# Patient Record
Sex: Male | Born: 1935 | Race: White | Hispanic: No | Marital: Married
Health system: Southern US, Community
[De-identification: ages and names within clinical notes are randomized; demographics above are authoritative.]

---

## 2009-03-18 ENCOUNTER — Inpatient Hospital Stay (HOSPITAL_COMMUNITY): Admission: EM | Admit: 2009-03-18 | Discharge: 2009-03-21 | Payer: Self-pay | Admitting: Emergency Medicine

## 2009-03-20 ENCOUNTER — Encounter (INDEPENDENT_AMBULATORY_CARE_PROVIDER_SITE_OTHER): Payer: Self-pay | Admitting: *Deleted

## 2009-03-21 ENCOUNTER — Ambulatory Visit: Payer: Self-pay | Admitting: Vascular Surgery

## 2010-04-28 ENCOUNTER — Emergency Department: Payer: Self-pay | Admitting: Emergency Medicine

## 2011-02-26 LAB — CBC
Hemoglobin: 14.9 g/dL (ref 13.0–17.0)
MCHC: 35 g/dL (ref 30.0–36.0)
MCV: 89.7 fL (ref 78.0–100.0)
RBC: 4.73 MIL/uL (ref 4.22–5.81)
RDW: 14 % (ref 11.5–15.5)

## 2011-02-26 LAB — COMPREHENSIVE METABOLIC PANEL
CO2: 24 mEq/L (ref 19–32)
Calcium: 9 mg/dL (ref 8.4–10.5)
Creatinine, Ser: 1.38 mg/dL (ref 0.4–1.5)
GFR calc Af Amer: >60 mL/min (ref >60)
GFR calc non Af Amer: 50 mL/min — ABNORMAL LOW (ref >60)
Glucose, Bld: 113 mg/dL — ABNORMAL HIGH (ref 70–99)
Total Protein: 6.2 g/dL (ref 6.0–8.3)

## 2011-02-26 LAB — TSH: TSH: 4.991 u[IU]/mL — ABNORMAL HIGH (ref 0.350–4.500)

## 2011-02-26 LAB — PROTIME-INR
INR: 1 (ref 0.00–1.49)
Prothrombin Time: 13.3 seconds (ref 11.6–15.2)

## 2011-02-26 LAB — POCT CARDIAC MARKERS
CKMB, poc: <1 ng/mL — ABNORMAL LOW (ref 1.0–8.0)
Myoglobin, poc: 105 ng/mL (ref 12–200)
Troponin i, poc: <0.05 ng/mL (ref 0.00–0.09)

## 2011-02-26 LAB — T4, FREE: Free T4: 0.96 ng/dL (ref 0.80–1.80)

## 2011-02-26 LAB — URINALYSIS, ROUTINE W REFLEX MICROSCOPIC
Glucose, UA: NEGATIVE mg/dL
Ketones, ur: NEGATIVE mg/dL
Nitrite: NEGATIVE
Protein, ur: NEGATIVE mg/dL
Urobilinogen, UA: 1 mg/dL (ref 0.0–1.0)

## 2011-02-26 LAB — TROPONIN I: Troponin I: 0.02 ng/mL (ref 0.00–0.06)

## 2011-02-26 LAB — LIPID PANEL
HDL: 33 mg/dL — ABNORMAL LOW (ref >39)
Triglycerides: 101 mg/dL (ref <150)

## 2011-02-26 LAB — DIFFERENTIAL
Lymphocytes Relative: 19 % (ref 12–46)
Lymphs Abs: 1.4 10*3/uL (ref 0.7–4.0)
Neutrophils Relative %: 73 % (ref 43–77)

## 2011-02-26 LAB — CK TOTAL AND CKMB (NOT AT ARMC)
CK, MB: 1.2 ng/mL (ref 0.3–4.0)
Relative Index: INVALID (ref 0.0–2.5)

## 2011-02-26 LAB — T3, FREE: T3, Free: 2.9 pg/mL (ref 2.3–4.2)

## 2011-04-02 NOTE — H&P (Signed)
NAME:  James Gibson, James Gibson NO.:  [id number]   MEDICAL RECORD NO.:  [id number]          PATIENT TYPE:  EMS   LOCATION:  MAJO                         FACILITY:  [facility]   PHYSICIAN:  Manus Gunning, MD      DATE OF BIRTH:  Mar 25, 1930   DATE OF ADMISSION:  03/18/2009  DATE OF DISCHARGE:                              HISTORY & PHYSICAL   ADMITTING SERVICE:  Hospitalist [facility].   CHIEF COMPLAINT:  Syncopal episode.   HISTORY OF PRESENT ILLNESS:  Mr. Depuy is a 77 year old Caucasian male  who was walking to his shed to obtain a jug of gas for his weed whacker  when he suddenly experienced an episode of syncope where he suddenly  lost consciousness and hit the ground.  His wife was sitting on the  porch and witnessed the episode.  Said he was on the floor for  approximately 30 seconds.  There were no seizure movements.  He was just  placid on the floor.  The patient claims that he did not have bowel or  bladder incontinence.  No perceiving symptoms including no chest pain or  palpitations, no headache or aura, no tenderness, no blurring of vision.  He claims he woke up, he got up and now was able to walk to the front  porch, but in doing so, he claims his legs felt like lead, and his  wife claims that he was unsteady on his feet.  The patient claims that  he does not know how long this lasted.  He came and he sat in the chair,  and then when he was able to get up approximately one hour later he felt  fine and he was steady.  He denies headaches, denies fevers, denies any  sick contacts.  No recent travel history.  No generalized weakness.  No  fatigue.  No tenderness.  No blurring of vision.  No odynophagia,  dysphagia.  No slurring of speech.  No chest pain, palpitations, PND or  orthopnea.  No cough or expectoration.  No abdominal pain.  No nausea,  vomiting, diarrhea, constipation.  No hematuria, polyuria, dysuria.  No  other musculoskeletal complaints.   PAST  MEDICAL/SURGICAL HISTORY:  1. Throat cancer status post partial laryngectomy.  2. Lung cancers, squamous cell, status post wedge resection with      complete resolution.  3. COPD, quit smoking approximately 15 years ago.  Used to smoke one      pack a day previously.  4. Paroxysmal atrial fibrillation, only two episodes documented.  5. Degenerative disk disease with cervical spine surgery.  6. 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   ALLERGIES:  HYDROCORTISONE, MORPHINE, COUGH MEDICATIONS, ALBUTEROL.   SOCIAL HISTORY:  Quit tobacco 15 years ago.  Was a one pack per day  smoker preceding that times approximately 40 years.  Quite alcohol.  Was  a social drinker.  Lives at home with his wife and daughter and family  lives close by.   FAMILY HISTORY:  Father dead  at age of 80, cardiac arrest.  Mother had  history of hypertensive disease.   HOME MEDICATIONS:  1. Cardizem 240 mg p.o. daily.  2. Flomax 0.4 mg daily.  3. Spiriva one capsule inhalation daily.  4. Bupropion 100 mg p.o. daily.  5. Finasteride 5 mg daily.  6. Flovent 110 mcg daily.  7. MiraLax daily p.r.n.   REVIEW OF SYSTEMS:  A 14-point review of systems performed, pertinent  positives negative as described above.   PHYSICAL EXAMINATION:  VITAL SIGNS:  At the time of presentation,  temperature 97.5, heart rate 64, respirations 20, blood pressure 124/55,  O2 saturation 98% on room air.  GENERAL:  Well-nourished, well-developed elderly gentleman sitting up in  bed comfortably, no apparent distress.  HEENT:  Normocephalic, atraumatic.  Moist oral mucosa.  No thrush or  post nasal drip.  Eyes:  Anicteric icterus.  Extraocular movements  intact.  Pupils equal, round and reactive to light and accommodation.  NECK:  Supple.  Good range of motion, no thyromegaly.  No carotid  bruits.  Neck veins appear normal.  CARDIOVASCULAR:  S1, S2.  Normal rate and rhythm.  No murmurs,  rubs or  gallops.  RESPIRATORY:  Air entry bilaterally equal.  No wheezes, rales or  rhonchi.  ABDOMEN:  Soft, nontender, nondistended, positive bowel sounds.  No  organomegaly.  EXTREMITIES:  NO cyanosis, clubbing or edema.  Positive bilateral  dorsalis pedis.  CNS:  Alert and oriented x3.  Cranial nerves II-XII grossly intact.  Power, sensation, reflex bilaterally symmetrical.  SKIN:  No breakdown.  No ulcerations or masses.  HEME/ONC:  No palpable lymphadenopathy.  Echymoses, bruising or  petechiae.   LABORATORY DATA:  WBC count 7300, hemoglobin 14.9, hematocrit 42.5,  platelets 143, polymorphs 73.  Sodium 138, potassium 4.3, chloride 108,  CO2 22, glucose 113, BUN 16, creatinine 1.38, calcium 9, total protein  6.2, albumin 3.8, AST 21, ALT 15.  Urinalysis demonstrates an  essentially negative.  X-ray of chest demonstrates pulmonary swelling,  no active process evident.  No bony abnormality seen.  Ribs appear no  evidence of rib fraction.  CT scan of the head was negative as well.   ASSESSMENT/PLAN:  1. Syncope.  Etiology of this is uncertain.  Could possibly be atrial      fibrillation, but this is unlikely as his atrial fibrillation      episodes have been perioperative.  Both have been perioperative,      and he maintains he has never had one since.  Also, could be      secondary to CVA.  He has some focal deficits.  Possible PA could      have resulted in these symptoms.  Therefore, we will obtain an MRI      as well as ultrasound bilateral carotid and also check a 2D      echocardiogram to assess left ventricular systolic function, though      this is unlikely to be a cause.  Place patient on telemetry to      assess for erythemogenic causes as well.  May require [place] monitor at the time of discharge.  2. Chronic obstructive pulmonary disease.  Continue home medications      and continue to monitor.  3. Paroxysmal atrial fibrillation.  Place on telemetry.   Continue with      diltiazem.  Of note, the patient's orthostatics have been measured      in  the emergency department, and he does appear to have been      orthostatic.  Standing blood pressure is 117/53, lying blood      pressure is 135/54.  This will need to be reassessed.  We will      start normal saline at 100 cc an hour and reevaluate in the      morning.  4. Deep vein thrombosis prophylaxis.  Start the patient on sequential      compression devices and Protonix 40 mg p.o. daily.      Manus Gunning, MD  Electronically Signed     SP/MEDQ  D:  03/18/2009  T:  03/19/2009  Job:  [id number]

## 2011-04-05 NOTE — Discharge Summary (Signed)
NAMECONSTANTINOS, James Gibson                ACCOUNT NO.:  [id number]   MEDICAL RECORD NO.:  [id number]          PATIENT TYPE:  INP   LOCATION:  3703                         FACILITY:  [facility]   PHYSICIAN:  Michelene Gardener, MD    DATE OF BIRTH:  August 18, 1930   DATE OF ADMISSION:  03/18/2009  DATE OF DISCHARGE:  03/21/2009                               DISCHARGE SUMMARY   DISCHARGE DIAGNOSES:  1. Syncope.  2. Carotid artery disease.  3. Diastolic congestive heart failure with grade 1 dysfunction.  4. Hypothyroidism.  5. Paroxysmal atrial fibrillation.  6. History of chronic obstructive pulmonary disease.  7. History of lung cancer.  8. History of throat cancer.   DISCHARGE MEDICATIONS:  1. Cardizem CD 240 mg p.o. once daily.  2. Flomax 0.4 mg once a day.  3. Spiriva 18 mcg 1 cap once a day.  4. Neosporin 100 mg p.o. once a day.  5. Flovent 1 puff twice daily.  6. Finasteride 5 mg once a day.  7. MiraLax once a day as needed.  8. Bupropion 100 mg once a day.   CONSULTATIONS:  Vascular Surgery consult.   PROCEDURES:  None.   DIAGNOSTIC STUDIES:  1. Chest x-ray on Mar 18, 2009, showed pulmonary scarring without acute      problem.  2. Unilateral left hip x-ray showed no acute abnormalities.  3. CT scan of the head without contrast on Mar 18, 2009, showed chronic-      appearing small vessel change with intermediate small-vessel      disease and old stroke.  4. 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  5. Echocardiogram on Mar 20, 2009, showed normal ejection fraction of      60% to 65%, wall motion is normal, and there is a grade 1 diastolic      dysfunction.  6. Bilateral carotid Doppler showed moderate-to-severe Blake origin      ICA and ECA with acoustic shadowing.  Right is 40% to 60% ICA      stenosis with highest end of a scale.  Vertebral artery flow is      anterior  grade.  Left is 40-60 ICA stenosis, mid range of his      scale.  Vertebral artery flow not insonated.   COURSE OF HOSPITALIZATION:  1. Syncope.  The patient was admitted to the hospital.  The patient      was placed in telemetry.  Three sets of troponin and cardiac      enzymes were done and they came to be normal.  CT scan of the head      was done and showed no evidence of acute problem.  MRI of the brain      was done and showed no evidence of acute problem and it showed      bilateral old strokes.  Carotid Doppler was done and it showed      bilateral carotid stenosis.  Vascular  Surgery was consulted, and      the patient will be followed as an outpatient for repeat carotid      Doppler.  2. Carotid artery disease.  As mentioned above, the patient has      bilateral stenosis, which is 40% to 60% bilaterally.  Vascular      Surgery was consulted during this hospital and they think his      current symptoms are not related to this carotid artery disease.      Recommendation by Vascular Surgery is to repeat his carotid Doppler      with his primary doctor within 6 months.  3. Diastolic congestive heart failure.  The patient has grade 1      stenosis.  The patient does not have any evidence of volume      overload.  No need for Lasix at this point.  The patient will      continue to follow as an outpatient.  4. Hypothyroidism.  TSH is very mildly elevated.  T3 and T4 are within      normal.  This is subclinical, no need for treatment.  We recommend      to repeat TSH within 3-6 months.  5. Paroxysmal AFib.  The patient was continued on Cardizem.  The      patient is not on Coumadin.  I advised the patient to follow with      her primary doctor to be assessed for possible Coumadin.  6. History of COPD, remained stable during this hospitalization.   Other medical conditions remained stable during this hospitalization.  The patient was discharged on Mar 21, 2009, in stable condition.    Total assessment time is 40 minutes.      Michelene Gardener, MD  Electronically Signed     Michelene Gardener, MD  Electronically Signed    NAE/MEDQ  D:  05/16/2009  T:  05/17/2009  Job:  [id number]

## 2012-06-01 ENCOUNTER — Inpatient Hospital Stay: Payer: Self-pay | Admitting: Internal Medicine

## 2012-06-01 LAB — URINALYSIS, COMPLETE
Glucose,UR: NEGATIVE mg/dL (ref 0–75)
Hyaline Cast: 64
Ph: 5 (ref 4.5–8.0)
Protein: NEGATIVE
Specific Gravity: 1.023 (ref 1.003–1.030)
Squamous Epithelial: 2
WBC UR: 22 /HPF (ref 0–5)

## 2012-06-01 LAB — COMPREHENSIVE METABOLIC PANEL
Alkaline Phosphatase: 132 U/L (ref 50–136)
Anion Gap: 10 (ref 7–16)
BUN: 29 mg/dL — ABNORMAL HIGH (ref 7–18)
Bilirubin,Total: 1.3 mg/dL — ABNORMAL HIGH (ref 0.2–1.0)
Calcium, Total: 9.6 mg/dL (ref 8.5–10.1)
Co2: 28 mmol/L (ref 21–32)
Creatinine: 2.14 mg/dL — ABNORMAL HIGH (ref 0.60–1.30)
EGFR (Non-African Amer.): 28 — ABNORMAL LOW
Glucose: 176 mg/dL — ABNORMAL HIGH (ref 65–99)
Osmolality: 280 (ref 275–301)
Potassium: 5.5 mmol/L — ABNORMAL HIGH (ref 3.5–5.1)
Sodium: 135 mmol/L — ABNORMAL LOW (ref 136–145)
Total Protein: 8.2 g/dL (ref 6.4–8.2)

## 2012-06-01 LAB — CBC
MCH: 26.7 pg (ref 26.0–34.0)
MCHC: 31.9 g/dL — ABNORMAL LOW (ref 32.0–36.0)
Platelet: 246 10*3/uL (ref 150–440)
RDW: 16 % — ABNORMAL HIGH (ref 11.5–14.5)

## 2012-06-01 LAB — TROPONIN I
Troponin-I: <0.02 ng/mL
Troponin-I: <0.02 ng/mL

## 2012-06-01 LAB — CK TOTAL AND CKMB (NOT AT ARMC): CK-MB: 0.8 ng/mL (ref 0.5–3.6)

## 2012-06-01 LAB — PROTIME-INR: Prothrombin Time: 18 secs — ABNORMAL HIGH (ref 11.5–14.7)

## 2012-06-02 LAB — HEMOGLOBIN A1C: Hemoglobin A1C: 5.6 % (ref 4.2–6.3)

## 2012-06-02 LAB — CBC WITH DIFFERENTIAL/PLATELET
Basophil %: 0 %
Eosinophil #: 0 10*3/uL (ref 0.0–0.7)
HGB: 12.6 g/dL — ABNORMAL LOW (ref 13.0–18.0)
Lymphocyte %: 5 %
MCH: 26.9 pg (ref 26.0–34.0)
MCHC: 31.9 g/dL — ABNORMAL LOW (ref 32.0–36.0)
Monocyte #: 0.5 x10 3/mm (ref 0.2–1.0)
Monocyte %: 3.8 %
Neutrophil #: 12.5 10*3/uL — ABNORMAL HIGH (ref 1.4–6.5)
RBC: 4.69 10*6/uL (ref 4.40–5.90)
WBC: 13.7 10*3/uL — ABNORMAL HIGH (ref 3.8–10.6)

## 2012-06-02 LAB — BASIC METABOLIC PANEL
Calcium, Total: 7.8 mg/dL — ABNORMAL LOW (ref 8.5–10.1)
Chloride: 106 mmol/L (ref 98–107)
EGFR (African American): 50 — ABNORMAL LOW
Osmolality: 286 (ref 275–301)
Potassium: 4.5 mmol/L (ref 3.5–5.1)
Sodium: 138 mmol/L (ref 136–145)

## 2012-06-02 LAB — LIPID PANEL
HDL Cholesterol: 42 mg/dL (ref 40–60)
Ldl Cholesterol, Calc: 30 mg/dL (ref 0–100)
Triglycerides: 43 mg/dL (ref 0–200)

## 2012-06-02 LAB — MAGNESIUM: Magnesium: 2 mg/dL

## 2012-06-02 LAB — TROPONIN I: Troponin-I: <0.02 ng/mL

## 2012-06-02 LAB — CK TOTAL AND CKMB (NOT AT ARMC)
CK, Total: 56 U/L (ref 35–232)
CK-MB: 1.3 ng/mL (ref 0.5–3.6)

## 2012-06-03 LAB — CBC WITH DIFFERENTIAL/PLATELET
Basophil #: 0 10*3/uL (ref 0.0–0.1)
Basophil %: 0.1 %
Eosinophil #: 0.1 10*3/uL (ref 0.0–0.7)
HCT: 35 % — ABNORMAL LOW (ref 40.0–52.0)
HGB: 11.5 g/dL — ABNORMAL LOW (ref 13.0–18.0)
Lymphocyte %: 12 %
MCV: 83 fL (ref 80–100)
Monocyte #: 0.6 x10 3/mm (ref 0.2–1.0)
Monocyte %: 6 %
Neutrophil #: 8.3 10*3/uL — ABNORMAL HIGH (ref 1.4–6.5)
Neutrophil %: 81.3 %
RDW: 16.3 % — ABNORMAL HIGH (ref 11.5–14.5)
WBC: 10.3 10*3/uL (ref 3.8–10.6)

## 2012-06-03 LAB — BASIC METABOLIC PANEL
Anion Gap: 8 (ref 7–16)
BUN: 26 mg/dL — ABNORMAL HIGH (ref 7–18)
Calcium, Total: 7.9 mg/dL — ABNORMAL LOW (ref 8.5–10.1)
EGFR (African American): 60 — ABNORMAL LOW
EGFR (Non-African Amer.): 52 — ABNORMAL LOW
Glucose: 88 mg/dL (ref 65–99)
Osmolality: 284 (ref 275–301)
Sodium: 140 mmol/L (ref 136–145)

## 2013-03-30 ENCOUNTER — Inpatient Hospital Stay: Payer: Self-pay | Admitting: Internal Medicine

## 2013-03-30 LAB — CBC
HGB: 14.6 g/dL (ref 13.0–18.0)
MCH: 30 pg (ref 26.0–34.0)
MCHC: 34.5 g/dL (ref 32.0–36.0)
RDW: 14 % (ref 11.5–14.5)
WBC: 9.9 10*3/uL (ref 3.8–10.6)

## 2013-03-30 LAB — URINALYSIS, COMPLETE
Bacteria: NONE SEEN
Bilirubin,UR: NEGATIVE
Blood: NEGATIVE
Glucose,UR: NEGATIVE mg/dL (ref 0–75)
Ph: 8 (ref 4.5–8.0)
RBC,UR: 8 /HPF (ref 0–5)
Specific Gravity: 1.01 (ref 1.003–1.030)
Squamous Epithelial: 1

## 2013-03-30 LAB — COMPREHENSIVE METABOLIC PANEL
Albumin: 3.4 g/dL (ref 3.4–5.0)
Alkaline Phosphatase: 113 U/L (ref 50–136)
Chloride: 97 mmol/L — ABNORMAL LOW (ref 98–107)
Creatinine: 1.09 mg/dL (ref 0.60–1.30)
EGFR (African American): >60
EGFR (Non-African Amer.): >60
Glucose: 98 mg/dL (ref 65–99)
Potassium: 4.4 mmol/L (ref 3.5–5.1)
SGOT(AST): 26 U/L (ref 15–37)
SGPT (ALT): 16 U/L (ref 12–78)
Sodium: 129 mmol/L — ABNORMAL LOW (ref 136–145)

## 2013-03-30 LAB — CK TOTAL AND CKMB (NOT AT ARMC): CK-MB: <0.5 ng/mL — ABNORMAL LOW (ref 0.5–3.6)

## 2013-03-31 LAB — CBC WITH DIFFERENTIAL/PLATELET
Eosinophil %: 0 %
Lymphocyte %: 7.8 %
MCHC: 35.7 g/dL (ref 32.0–36.0)
Monocyte #: 0.2 x10 3/mm (ref 0.2–1.0)
RDW: 14 % (ref 11.5–14.5)
WBC: 7.9 10*3/uL (ref 3.8–10.6)

## 2013-03-31 LAB — BASIC METABOLIC PANEL
Anion Gap: 8 (ref 7–16)
BUN: 15 mg/dL (ref 7–18)
Chloride: 99 mmol/L (ref 98–107)
Co2: 23 mmol/L (ref 21–32)
EGFR (African American): >60
Glucose: 151 mg/dL — ABNORMAL HIGH (ref 65–99)
Osmolality: 265 (ref 275–301)
Sodium: 130 mmol/L — ABNORMAL LOW (ref 136–145)

## 2013-04-02 LAB — BASIC METABOLIC PANEL
Anion Gap: 5 — ABNORMAL LOW (ref 7–16)
Calcium, Total: 8.1 mg/dL — ABNORMAL LOW (ref 8.5–10.1)
Chloride: 103 mmol/L (ref 98–107)
EGFR (African American): >60

## 2013-04-02 LAB — CBC WITH DIFFERENTIAL/PLATELET
Basophil %: 0.1 %
Eosinophil #: 0 10*3/uL (ref 0.0–0.7)
HCT: 38.5 % — ABNORMAL LOW (ref 40.0–52.0)
Lymphocyte #: 0.7 10*3/uL — ABNORMAL LOW (ref 1.0–3.6)
Lymphocyte %: 4.8 %
MCHC: 34.6 g/dL (ref 32.0–36.0)
MCV: 87 fL (ref 80–100)
Neutrophil #: 12.5 10*3/uL — ABNORMAL HIGH (ref 1.4–6.5)
Neutrophil %: 92.3 %
RBC: 4.44 10*6/uL (ref 4.40–5.90)
RDW: 14.3 % (ref 11.5–14.5)
WBC: 13.6 10*3/uL — ABNORMAL HIGH (ref 3.8–10.6)

## 2013-04-03 LAB — BASIC METABOLIC PANEL
Anion Gap: 6 — ABNORMAL LOW (ref 7–16)
Calcium, Total: 8.4 mg/dL — ABNORMAL LOW (ref 8.5–10.1)
Chloride: 100 mmol/L (ref 98–107)
Creatinine: 1.04 mg/dL (ref 0.60–1.30)
EGFR (African American): >60
Osmolality: 275 (ref 275–301)
Potassium: 4.6 mmol/L (ref 3.5–5.1)
Sodium: 136 mmol/L (ref 136–145)

## 2013-04-05 LAB — CULTURE, BLOOD (SINGLE)

## 2013-12-13 ENCOUNTER — Emergency Department: Payer: Self-pay | Admitting: Emergency Medicine

## 2014-05-20 IMAGING — CT CT HEAD WITHOUT CONTRAST
1 series · 1 of 1 positions shown · non-contrast
Comparison: 03/30/2013

CLINICAL DATA: Fall with trauma to the head.

EXAM:
CT HEAD WITHOUT CONTRAST
TECHNIQUE: Contiguous axial images were obtained from the base of the skull
through the vertex without intravenous contrast.

[Series 1: topogram 1.0 t20s · sagittal · 1.0mm · 1.00mm/px · 1 of 1 slices shown]
[im 1/1]
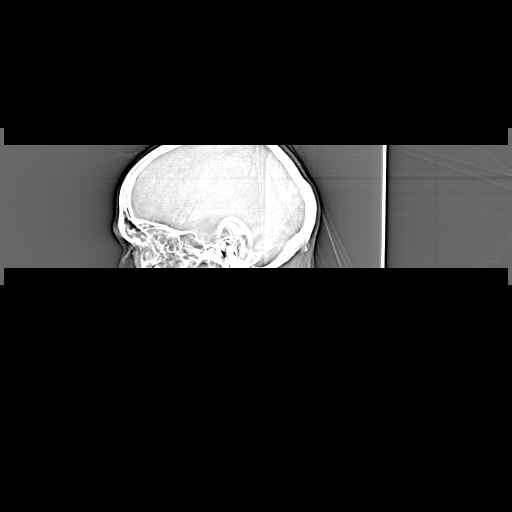

[1 of 1 positions shown; findings below may reference images not displayed]

FINDINGS: The brain shows generalized age related atrophy. There are mild
chronic small-vessel changes of the hemispheric deep white matter.
No evidence of acute infarction, mass lesion, hemorrhage,
hydrocephalus or extra-axial collection. No skull fracture. No fluid
in the sinuses, middle ears or mastoids. There has been previous
mastoidectomy on the left.
IMPRESSION: No acute or traumatic finding.  Age related atrophy.

## 2014-09-18 ENCOUNTER — Ambulatory Visit: Payer: Self-pay | Admitting: Internal Medicine

## 2014-10-12 ENCOUNTER — Inpatient Hospital Stay: Payer: Self-pay | Admitting: Internal Medicine

## 2014-10-12 LAB — URINALYSIS, COMPLETE
BLOOD: NEGATIVE
Bacteria: NONE SEEN
Bilirubin,UR: NEGATIVE
GLUCOSE, UR: NEGATIVE mg/dL (ref 0–75)
Ketone: NEGATIVE
Leukocyte Esterase: NEGATIVE
Nitrite: NEGATIVE
Ph: 5 (ref 4.5–8.0)
Protein: NEGATIVE
RBC,UR: <1 /HPF (ref 0–5)
SPECIFIC GRAVITY: 1.03 (ref 1.003–1.030)

## 2014-10-12 LAB — CLOSTRIDIUM DIFFICILE(ARMC)

## 2014-10-12 LAB — COMPREHENSIVE METABOLIC PANEL
ANION GAP: 7 (ref 7–16)
Albumin: 2.6 g/dL — ABNORMAL LOW (ref 3.4–5.0)
Alkaline Phosphatase: 115 U/L
BUN: 32 mg/dL — ABNORMAL HIGH (ref 7–18)
Bilirubin,Total: 1 mg/dL (ref 0.2–1.0)
Calcium, Total: 8.3 mg/dL — ABNORMAL LOW (ref 8.5–10.1)
Chloride: 103 mmol/L (ref 98–107)
Co2: 32 mmol/L (ref 21–32)
Creatinine: 1.28 mg/dL (ref 0.60–1.30)
EGFR (Non-African Amer.): 57 — ABNORMAL LOW
GLUCOSE: 135 mg/dL — AB (ref 65–99)
Osmolality: 292 (ref 275–301)
Potassium: 3.9 mmol/L (ref 3.5–5.1)
SGOT(AST): 28 U/L (ref 15–37)
SGPT (ALT): 24 U/L
Sodium: 142 mmol/L (ref 136–145)
Total Protein: 6.5 g/dL (ref 6.4–8.2)

## 2014-10-12 LAB — TROPONIN I
TROPONIN-I: 0.14 ng/mL — AB
Troponin-I: 0.04 ng/mL
Troponin-I: 0.13 ng/mL — ABNORMAL HIGH

## 2014-10-12 LAB — CBC WITH DIFFERENTIAL/PLATELET
BASOS ABS: 0.1 10*3/uL (ref 0.0–0.1)
Basophil %: 0.3 %
EOS PCT: 0.3 %
Eosinophil #: 0.1 10*3/uL (ref 0.0–0.7)
HCT: 46.1 % (ref 40.0–52.0)
HGB: 14.9 g/dL (ref 13.0–18.0)
Lymphocyte #: 1.9 10*3/uL (ref 1.0–3.6)
Lymphocyte %: 7.9 %
MCH: 29.8 pg (ref 26.0–34.0)
MCHC: 32.3 g/dL (ref 32.0–36.0)
MCV: 92 fL (ref 80–100)
Monocyte #: 0.5 x10 3/mm (ref 0.2–1.0)
Monocyte %: 1.9 %
Neutrophil #: 21.8 10*3/uL — ABNORMAL HIGH (ref 1.4–6.5)
Neutrophil %: 89.6 %
Platelet: 212 10*3/uL (ref 150–440)
RBC: 5 10*6/uL (ref 4.40–5.90)
RDW: 16.5 % — ABNORMAL HIGH (ref 11.5–14.5)
WBC: 24.3 10*3/uL — AB (ref 3.8–10.6)

## 2014-10-12 LAB — PROTIME-INR
INR: 1.2
Prothrombin Time: 15.1 secs — ABNORMAL HIGH (ref 11.5–14.7)

## 2014-10-12 LAB — CK TOTAL AND CKMB (NOT AT ARMC)
CK, TOTAL: 34 U/L — AB (ref 39–308)
CK-MB: 1.1 ng/mL (ref 0.5–3.6)

## 2014-10-13 LAB — CBC WITH DIFFERENTIAL/PLATELET
BASOS ABS: 0 10*3/uL (ref 0.0–0.1)
Basophil %: 0.1 %
EOS ABS: 0 10*3/uL (ref 0.0–0.7)
Eosinophil %: 0 %
HCT: 33 % — ABNORMAL LOW (ref 40.0–52.0)
HGB: 10.5 g/dL — ABNORMAL LOW (ref 13.0–18.0)
LYMPHS PCT: 4.6 %
Lymphocyte #: 0.7 10*3/uL — ABNORMAL LOW (ref 1.0–3.6)
MCH: 29.5 pg (ref 26.0–34.0)
MCHC: 32 g/dL (ref 32.0–36.0)
MCV: 92 fL (ref 80–100)
Monocyte #: 0.3 x10 3/mm (ref 0.2–1.0)
Monocyte %: 2.2 %
Neutrophil #: 14 10*3/uL — ABNORMAL HIGH (ref 1.4–6.5)
Neutrophil %: 93.1 %
PLATELETS: 120 10*3/uL — AB (ref 150–440)
RBC: 3.58 10*6/uL — ABNORMAL LOW (ref 4.40–5.90)
RDW: 16.2 % — ABNORMAL HIGH (ref 11.5–14.5)
WBC: 15 10*3/uL — AB (ref 3.8–10.6)

## 2014-10-13 LAB — BASIC METABOLIC PANEL
ANION GAP: 4 — AB (ref 7–16)
BUN: 19 mg/dL — ABNORMAL HIGH (ref 7–18)
CHLORIDE: 108 mmol/L — AB (ref 98–107)
Calcium, Total: 7.1 mg/dL — ABNORMAL LOW (ref 8.5–10.1)
Co2: 32 mmol/L (ref 21–32)
Creatinine: 0.98 mg/dL (ref 0.60–1.30)
Glucose: 182 mg/dL — ABNORMAL HIGH (ref 65–99)
OSMOLALITY: 294 (ref 275–301)
POTASSIUM: 3.2 mmol/L — AB (ref 3.5–5.1)
Sodium: 144 mmol/L (ref 136–145)

## 2014-10-13 LAB — URINE CULTURE

## 2014-10-13 LAB — MAGNESIUM: MAGNESIUM: 1.9 mg/dL

## 2014-10-14 LAB — PROTEIN, BODY FLUID: Protein, Body Fluid: 0.8 g/dL

## 2014-10-14 LAB — BASIC METABOLIC PANEL
Anion Gap: 1 — ABNORMAL LOW (ref 7–16)
BUN: 15 mg/dL (ref 7–18)
CALCIUM: 8 mg/dL — AB (ref 8.5–10.1)
CHLORIDE: 102 mmol/L (ref 98–107)
CO2: 37 mmol/L — AB (ref 21–32)
CREATININE: 1.04 mg/dL (ref 0.60–1.30)
EGFR (Non-African Amer.): >60
GLUCOSE: 240 mg/dL — AB (ref 65–99)
Osmolality: 288 (ref 275–301)
Potassium: 3.9 mmol/L (ref 3.5–5.1)
Sodium: 140 mmol/L (ref 136–145)

## 2014-10-14 LAB — BODY FLUID CELL COUNT WITH DIFFERENTIAL
BASOS ABS: 0 %
Eosinophil: 0 %
LYMPHS PCT: 25 %
NUCLEATED CELL COUNT: 820 /mm3
Neutrophils: 55 %
Other Cells BF: 0 %
Other Mononuclear Cells: 20 %

## 2014-10-14 LAB — PROTIME-INR
INR: 1.1
Prothrombin Time: 14.5 secs (ref 11.5–14.7)

## 2014-10-14 LAB — MAGNESIUM: Magnesium: 2.1 mg/dL

## 2014-10-14 LAB — CBC WITH DIFFERENTIAL/PLATELET
Basophil #: 0 10*3/uL (ref 0.0–0.1)
Basophil %: 0 %
EOS PCT: 0 %
Eosinophil #: 0 10*3/uL (ref 0.0–0.7)
HCT: 31.2 % — ABNORMAL LOW (ref 40.0–52.0)
HGB: 10.2 g/dL — ABNORMAL LOW (ref 13.0–18.0)
LYMPHS ABS: 0.2 10*3/uL — AB (ref 1.0–3.6)
Lymphocyte %: 2.1 %
MCH: 30 pg (ref 26.0–34.0)
MCHC: 32.8 g/dL (ref 32.0–36.0)
MCV: 91 fL (ref 80–100)
MONOS PCT: 1.7 %
Monocyte #: 0.2 x10 3/mm (ref 0.2–1.0)
NEUTROS ABS: 11.4 10*3/uL — AB (ref 1.4–6.5)
Neutrophil %: 96.2 %
Platelet: 114 10*3/uL — ABNORMAL LOW (ref 150–440)
RBC: 3.41 10*6/uL — ABNORMAL LOW (ref 4.40–5.90)
RDW: 16 % — AB (ref 11.5–14.5)
WBC: 11.8 10*3/uL — AB (ref 3.8–10.6)

## 2014-10-14 LAB — APTT: Activated PTT: 31.2 secs (ref 23.6–35.9)

## 2014-10-14 LAB — EXPECTORATED SPUTUM ASSESSMENT W REFEX TO RESP CULTURE

## 2014-10-14 LAB — ALBUMIN, FLUID (OTHER): Body Fluid Albumin: <0.6 g/dL

## 2014-10-14 LAB — AMYLASE, BODY FLUID: AMYLASE, BODY FLUID: 16 U/L

## 2014-10-14 LAB — LACTATE DEHYDROGENASE, PLEURAL OR PERITONEAL FLUID: LDH, Body Fluid: 83 U/L

## 2014-10-14 LAB — GLUCOSE, SEROUS FLUID: Glucose, Body Fluid: 222 mg/dL

## 2014-10-14 LAB — PHOSPHORUS: PHOSPHORUS: 2.1 mg/dL — AB (ref 2.5–4.9)

## 2014-10-15 LAB — BASIC METABOLIC PANEL
Anion Gap: 6 — ABNORMAL LOW (ref 7–16)
BUN: 16 mg/dL (ref 7–18)
Calcium, Total: 7.8 mg/dL — ABNORMAL LOW (ref 8.5–10.1)
Chloride: 98 mmol/L (ref 98–107)
Co2: 36 mmol/L — ABNORMAL HIGH (ref 21–32)
Creatinine: 0.89 mg/dL (ref 0.60–1.30)
Glucose: 162 mg/dL — ABNORMAL HIGH (ref 65–99)
OSMOLALITY: 284 (ref 275–301)
Potassium: 3.1 mmol/L — ABNORMAL LOW (ref 3.5–5.1)
Sodium: 140 mmol/L (ref 136–145)

## 2014-10-15 LAB — VANCOMYCIN, TROUGH: Vancomycin, Trough: 13 ug/mL (ref 10–20)

## 2014-10-15 LAB — POTASSIUM: POTASSIUM: 3.3 mmol/L — AB (ref 3.5–5.1)

## 2014-10-15 LAB — MAGNESIUM: Magnesium: 2.5 mg/dL — ABNORMAL HIGH

## 2014-10-16 LAB — BASIC METABOLIC PANEL
Anion Gap: 5 — ABNORMAL LOW (ref 7–16)
BUN: 19 mg/dL — ABNORMAL HIGH (ref 7–18)
CALCIUM: 7.8 mg/dL — AB (ref 8.5–10.1)
CHLORIDE: 94 mmol/L — AB (ref 98–107)
CO2: 39 mmol/L — AB (ref 21–32)
CREATININE: 0.93 mg/dL (ref 0.60–1.30)
EGFR (African American): >60
EGFR (Non-African Amer.): >60
GLUCOSE: 207 mg/dL — AB (ref 65–99)
OSMOLALITY: 284 (ref 275–301)
POTASSIUM: 2.8 mmol/L — AB (ref 3.5–5.1)
Sodium: 138 mmol/L (ref 136–145)

## 2014-10-16 LAB — CBC WITH DIFFERENTIAL/PLATELET
BASOS PCT: 0.1 %
Basophil #: 0 10*3/uL (ref 0.0–0.1)
EOS ABS: 0 10*3/uL (ref 0.0–0.7)
Eosinophil %: 0.3 %
HCT: 34.8 % — ABNORMAL LOW (ref 40.0–52.0)
HGB: 11.5 g/dL — ABNORMAL LOW (ref 13.0–18.0)
Lymphocyte #: 0.3 10*3/uL — ABNORMAL LOW (ref 1.0–3.6)
Lymphocyte %: 4.1 %
MCH: 29.7 pg (ref 26.0–34.0)
MCHC: 33 g/dL (ref 32.0–36.0)
MCV: 90 fL (ref 80–100)
MONO ABS: 0.1 x10 3/mm — AB (ref 0.2–1.0)
Monocyte %: 1.6 %
NEUTROS ABS: 7.8 10*3/uL — AB (ref 1.4–6.5)
NEUTROS PCT: 93.9 %
PLATELETS: 137 10*3/uL — AB (ref 150–440)
RBC: 3.86 10*6/uL — ABNORMAL LOW (ref 4.40–5.90)
RDW: 15.6 % — AB (ref 11.5–14.5)
WBC: 8.3 10*3/uL (ref 3.8–10.6)

## 2014-10-16 LAB — POTASSIUM: Potassium: 2.8 mmol/L — ABNORMAL LOW (ref 3.5–5.1)

## 2014-10-16 LAB — MAGNESIUM
MAGNESIUM: 2.4 mg/dL
Magnesium: 2.3 mg/dL

## 2014-10-16 LAB — OCCULT BLOOD X 1 CARD TO LAB, STOOL: Occult Blood, Feces: NEGATIVE

## 2014-10-17 LAB — BASIC METABOLIC PANEL
Anion Gap: 7 (ref 7–16)
BUN: 20 mg/dL — ABNORMAL HIGH (ref 7–18)
Calcium, Total: 7.8 mg/dL — ABNORMAL LOW (ref 8.5–10.1)
Chloride: 92 mmol/L — ABNORMAL LOW (ref 98–107)
Co2: 41 mmol/L (ref 21–32)
Creatinine: 0.94 mg/dL (ref 0.60–1.30)
EGFR (African American): >60
EGFR (Non-African Amer.): >60
Glucose: 175 mg/dL — ABNORMAL HIGH (ref 65–99)
OSMOLALITY: 286 (ref 275–301)
Potassium: 2.8 mmol/L — ABNORMAL LOW (ref 3.5–5.1)
Sodium: 140 mmol/L (ref 136–145)

## 2014-10-17 LAB — POTASSIUM: POTASSIUM: 4 mmol/L (ref 3.5–5.1)

## 2014-10-17 LAB — CULTURE, BLOOD (SINGLE)

## 2014-10-17 LAB — MAGNESIUM: MAGNESIUM: 2.3 mg/dL

## 2014-10-18 ENCOUNTER — Ambulatory Visit: Payer: Self-pay | Admitting: Internal Medicine

## 2014-10-18 LAB — BASIC METABOLIC PANEL
Anion Gap: 6 — ABNORMAL LOW (ref 7–16)
BUN: 21 mg/dL — ABNORMAL HIGH (ref 7–18)
CALCIUM: 8.1 mg/dL — AB (ref 8.5–10.1)
CREATININE: 1.01 mg/dL (ref 0.60–1.30)
Chloride: 99 mmol/L (ref 98–107)
Co2: 37 mmol/L — ABNORMAL HIGH (ref 21–32)
EGFR (African American): >60
Glucose: 99 mg/dL (ref 65–99)
OSMOLALITY: 286 (ref 275–301)
Potassium: 3.4 mmol/L — ABNORMAL LOW (ref 3.5–5.1)
SODIUM: 142 mmol/L (ref 136–145)

## 2014-10-18 LAB — MAGNESIUM: MAGNESIUM: 2.3 mg/dL

## 2014-10-18 LAB — PHOSPHORUS: Phosphorus: 1.2 mg/dL — ABNORMAL LOW (ref 2.5–4.9)

## 2014-10-18 LAB — BODY FLUID CULTURE

## 2014-10-19 LAB — VANCOMYCIN, TROUGH: Vancomycin, Trough: 23 ug/mL (ref 10–20)

## 2014-10-19 LAB — CREATININE, SERUM
Creatinine: 1.64 mg/dL — ABNORMAL HIGH (ref 0.60–1.30)
EGFR (African American): 52 — ABNORMAL LOW
EGFR (Non-African Amer.): 43 — ABNORMAL LOW

## 2014-10-19 LAB — PHOSPHORUS: PHOSPHORUS: 2.8 mg/dL (ref 2.5–4.9)

## 2014-10-20 ENCOUNTER — Inpatient Hospital Stay: Payer: Self-pay | Admitting: Internal Medicine

## 2014-10-20 LAB — CREATININE, SERUM
Creatinine: 1.6 mg/dL — ABNORMAL HIGH (ref 0.60–1.30)
EGFR (African American): 54 — ABNORMAL LOW
EGFR (Non-African Amer.): 44 — ABNORMAL LOW

## 2014-10-20 LAB — VANCOMYCIN, RANDOM: Vancomycin, Random: 22 ug/mL

## 2014-11-18 ENCOUNTER — Ambulatory Visit: Payer: Self-pay | Admitting: Internal Medicine

## 2014-11-18 DEATH — deceased

## 2015-03-07 NOTE — H&P (Signed)
PATIENT NAME:  James Gibson, STREEPER MR#:  [id number] DATE OF BIRTH:  12-26-1929  DATE OF ADMISSION:  06/01/2012  REFERRING PHYSICIAN: ER physician, Dr. Brien Mates   PRIMARY CARE PHYSICIAN: [facility]    CHIEF COMPLAINT: Nausea, vomiting, abdominal pain, cough, shortness of breath.   HISTORY OF PRESENT ILLNESS: The patient is an 81 year old male who is not a good historian. He appears to have a history of hypertension, COPD, BPH, anxiety, depression, and paroxysmal atrial fibrillation. The patient reports that he has been having cough and shortness of breath since April. Recently he also had chills but no fever. He came to the Emergency Room because he developed sudden onset of abdominal pain yesterday and started having nausea and vomiting after midnight. He reports that his last bowel movement was today in the Emergency Room. He reports he is not passing gas. Initially when he presented to the ED the ER physician reported that he was hypoxic and almost had a syncopal episode. Further evaluation revealed that he has pneumonia and distal small bowel obstruction.   ALLERGIES: Cortisone causes a rash. Albuterol causes tachycardia. Morphine causes urinary retention. The patient does not appear to have true allergies to any of these medications  CURRENT MEDICATIONS:  1. Finasteride 5 mg daily.  2. Flomax 0.4 mg daily.  3. Flovent 110 mcg 2 puffs b.i.d.  4. Fluocinonide 0.05% apply to affected area b.i.d.  5. GlycoLax one packet b.i.d.  6. HCTZ 25 mg half-tablet once a day.  7. Multaq 200 mg b.i.d.   8. Pradaxa 150 mg b.i.d.  9. Spiriva 18 mcg inhaled daily.  10. Wellbutrin 100 mg daily.    PAST MEDICAL HISTORY:  1. Anxiety. 2. Depression. 3. Paroxysmal atrial fibrillation. 4. Chronic obstructive pulmonary disease. 5. Hypertension. 6. Benign prostatic hypertrophy.  7. Lung cancer. 8. Throat cancer.  PAST SURGICAL HISTORY:   1. Surgery for lung and throat cancer. 2. Hemorrhoidectomy.   3. Appendectomy.   SOCIAL HISTORY: The patient reports he quit smoking 12 years ago. Denies any alcohol or drug abuse. He is married and lives with his wife.   FAMILY HISTORY: The patient does not know much about his father. His mother had hypertension. Sister had breast and thyroid cancer.   REVIEW OF SYSTEMS: CONSTITUTIONAL: Reports fatigue, chills, weakness. EYES: Denies any blurred or double vision. ENT: Denies any tinnitus, ear pain. Has chronic hoarse voice due to history of throat cancer. RESPIRATORY: Reports cough, shortness of breath. GI: Reports nausea, vomiting, abdominal pain. GU: Denies any dysuria, hematuria. ENDOCRINE: Denies any polyuria, nocturia. HEME/LYMPH: Denies any anemia, easy bruisability. INTEGUMENTARY: Denies any acne, rash. MUSCULOSKELETAL: Denies any swelling, gout. NEUROLOGICAL: Denies any numbness, weakness. PSYCH: Has history of anxiety and depression.    PHYSICAL EXAMINATION:   VITAL SIGNS: Temperature 97.4, heart rate 106, respiratory rate 22, blood pressure 114/56, pulse oximetry 96% currently.   GENERAL: The patient is a thin built Caucasian male laying in bed on his left side. He appears to be in some distress.   HEAD: Atraumatic, normocephalic.   EYES: There is some pallor. No icterus or cyanosis. Pupils equal, round, and reactive to light and accommodation. Extraocular movements intact.    ENT: Dry mucous membranes. No oropharyngeal erythema or thrush. The patient has a hoarse voice.   NECK: Supple. No masses. No JVD. No thyromegaly.   CHEST WALL: No tenderness to palpation. Not using accessory muscles of respiration. No intracostal muscle retractions.   LUNGS: Bilateral basal crepitations. No wheezing or rhonchi.  CARDIOVASCULAR: S1, S2 regular. There is no murmur, rubs, or gallops.   ABDOMEN: Soft with no guarding. No rigidity. The patient denies any tenderness at present. Hypoactive bowel sounds.   SKIN: The patient has bilateral lower  extremity chronic age-related hyperpigmentation.  PERIPHERIES: No pedal edema. 1+ pedal pulses.   MUSCULOSKELETAL: No cyanosis or clubbing.   PSYCH: Normal mood and affect.  CNS: Awake, alert, and oriented x3. Nonfocal neurological exam. Cranial nerves grossly intact.    LABORATORY, DIAGNOSTIC, AND RADIOLOGICAL DATA: Urinalysis shows 1+ bacteria, 22 WBCs, 1+ leukocyte esterase.   CT of the abdomen showed distal small bowel obstruction. No free air. Bibasilar pneumonia and bronchiectasia.    Chest x-ray showed atelectasis versus infiltrate within the lingula region of left lung base.   Cardiac enzymes negative. INR 1.5. White count 17.4, hemoglobin 16.5, hematocrit 51.7, platelets 246, glucose 176, BUN 29, creatinine 2.14, sodium 135, potassium 4.5, chloride 97. pO2 28. Lipase 62. Magnesium 2.2.   ASSESSMENT AND PLAN: This is an 81 year old male with past medical history of hypertension, COPD, BPH, paroxysmal atrial fibrillation who presents with cough, shortness of breath, nausea, vomiting, and abdominal pain.  1. Bilateral pneumonia. Will obtain blood culture, sputum culture, and start patient on empiric antibiotics. Will also place him on respiratory treatments and supplemental oxygen.  2. Urinary tract infection. Urinalysis suggestive of urinary tract infection. Will obtain urine cultures and treat empirically with antibiotics.  3. Distal small bowel obstruction. The patient currently denies any abdominal pain. He reports that his last bowel movement was in the ED. However, he reports that he is not passing any gas. He did not receive any pain medications in the ER. Will obtain a surgical consultation. Keep patient n.p.o. except for medication. Start on p.r.n. analgesics and antiemetics. Will repeat a KUB in a.m.  4. Leukocytosis likely due to above. Will monitor closely.  5. Hyperglycemia, possibly reactive. Will check a hemoglobin A1c. 6. Acute renal failure on chronic kidney disease.  Old records reveal that patient had chronic kidney disease with a creatinine of 1.32 in the past. No recent labs are available. Will try and get medical records from his PCP. Will hydrate with IV fluids. Monitor strict I's and O's. Avoid nephrotoxic medications.  7. Mild hyponatremia possibly due to dehydration from nausea and vomiting. Will hydrate with IV fluids. 8. Hyperkalemia, unclear etiology at present. The patient seems to be asymptomatic. He does not have any EKG changes. Will give him one dose of Lasix to help excretion through the urinary tract. Will monitor closely and monitor potassium closely and monitor on telemetry.  9. History of benign prostatic hypertrophy. Will hold his p.o. medications for the time being. 10. History of hypertension. Will hold his HCTZ for the time being since his blood pressure  is borderline low. 11. Chronic obstructive pulmonary disease. Will continue Flovent, Spiriva. Place on respiratory treatments.  12. History of paroxysmal atrial fibrillation. Will continue Multaq. Will hold his Pradaxa for the time being. He is currently in normal sinus rhythm.  13. Anxiety/depression. Will hold his Wellbutrin for the time being.  Discussed with the ED physician, discussed with the patient the plan of care and management.    TIME SPENT: 75 minutes.   ____________________________ Darrick MeigsSangeeta Sylvester Salonga, MD sp:drc D: 06/01/2012 16:12:36 ET T: 06/01/2012 16:54:17 ET JOB#: [id number]  cc: Darrick MeigsSangeeta Curt Oatis, MD, <Dictator> Darrick MeigsSANGEETA Alauna Hayden MD ELECTRONICALLY SIGNED 06/01/2012 19:18

## 2015-03-07 NOTE — Consult Note (Signed)
PATIENT NAME:  James Gibson, [name] MR#:  [id number] DATE OF BIRTH:  [birth year]  DATE OF CONSULTATION:  06/01/2012  REFERRING PHYSICIAN:   CONSULTING PHYSICIAN:  Juanmiguel Defelice A. Egbert GaribaldiBird, MD  REASON FOR CONSULTATION: Evaluation of ileus versus small bowel obstruction.   HISTORY: This is an 81 year old white male admitted to the hospital with bilateral lower lobe pneumonia, cough, shortness of breath, nausea, vomiting and abdominal pain.  This history dates back to Saturday when he started having some nausea followed by several episodes of emesis. Last bowel movement was on Saturday, last passage of gas was on Saturday. He then had progressive shortness of breath which is worse than his baseline. He does have a history of bronchiectasis and chronic obstructive pulmonary disease as well as a history of paroxysmal atrial fibrillation. Patient reported to the Emergency Room and was admitted by the medical service. CT scan was obtained of the abdomen and pelvis because of abdominal pain. There are dilated loops of small bowel consistent with a distal partial small bowel obstruction versus ileus and decompressed colon. Patient was hypoxic and also had a syncopal episode prior to his arrival.   ALLERGIES: Cortisone, albuterol, morphine.   MEDICATIONS:  1. Finasteride. 2. Flomax. 3. Flovent. 4. GlycoLax which he takes for constipation.  5. Hydrochlorothiazide. 6. Pradaxa. 7. Spiriva.  8. Wellbutrin. 9. Multaq.   PAST MEDICAL HISTORY:  1. Anxiety, depression. 2. Paroxysmal atrial fibrillation. 3. Chronic obstructive pulmonary disease. 4. Hypertension. 5. Benign prostatic hypertrophy. 6. History of lung cancer. 7. History of laryngeal cancer.   PAST SURGICAL HISTORY:  1. Open appendectomy. 2. Hemorrhoidectomy. 3. Head and neck carcinoma surgery in 1963.   SOCIAL HISTORY: Quit smoking 12 years ago. No drugs. No alcohol use. Married, lives with his wife.   FAMILY HISTORY: Noncontributory.   REVIEW OF  SYSTEMS: As described above.   PHYSICAL EXAMINATION:  GENERAL: The patient is short of breath with speaking, is in no distress, has got nasal cannula in place.   VITAL SIGNS: Temperature 98.9, pulse 89, respiratory rate 22, blood pressure 150/70.   ABDOMEN: Examination of his abdomen demonstrates right lower quadrant scar, moderately distended, mildly tympanitic. No peritoneal signs. No hernias or masses are evident.   SKIN: Warm and well perfused.   HEAD AND NECK: Unremarkable. The patient has a wispy voice which is chronic.   LABORATORY, DIAGNOSTIC AND RADIOLOGICAL DATA: Creatinine 2.14, sodium 135, potassium 5.5, glucose 176. Liver function tests normal. White count 17.5, hemoglobin 16.5, platelet count 246,000.   IMPRESSION: This is an 81 year old white male with what looks like pneumonia and resultant ileus, however, given his history of previous open appendectomy certainly could be a bowel obstruction with resulting aspiration pneumonia which would be less likely as it is bilateral.   RECOMMENDATIONS: 2-way of the abdomen can be obtained tonight looking for large gastric bubble. If one is present nasogastric tube will need to be inserted. Will follow the patient with you. Correction of his electrolytes and treatment of his pneumonia is imperative.  ____________________________ Redge GainerMark A. Egbert GaribaldiBird, MD mab:cms D: 06/02/2012 07:46:16 ET T: 06/02/2012 11:41:22 ET JOB#: [id number]  cc: Loraine LericheMark A. Egbert GaribaldiBird, MD, <Dictator> Raynald KempMARK A Montrel Donahoe MD ELECTRONICALLY SIGNED 06/02/2012 17:58

## 2015-03-07 NOTE — Discharge Summary (Signed)
PATIENT NAME:  James Gibson, SUMMERSON MR#:  [id number] DATE OF BIRTH:  Nov 14, 1930  DATE OF ADMISSION:  06/01/2012 DATE OF DISCHARGE:  06/03/2012  PRIMARY CARE PHYSICIAN: [facility]   DISCHARGE DIAGNOSES:  1. Bilateral basal pneumonia.  2. Acute respiratory failure.  3. Urinary tract infection. 4. Partial small bowel obstruction/ileus.  5. Acute renal failure.  6. Mild hyponatremia.  7. Hyperkalemia secondary to acute renal failure.  8. Paroxysmal atrial fibrillation.  9. Stable chronic anemia.   CONSULTANTS: Natale Lay, MD - Surgery.   IMAGING STUDIES: Chest x-ray showed bilateral basal atelectasis versus infiltrate.   Abdominal x-ray showed possible small bowel obstruction.   Repeat abdominal x-ray showed ileus.   ADMITTING HISTORY AND PHYSICAL: Please see detailed history and physical dictated on 06/01/2012. In brief, this is an 81 year old male patient with history of hypertension, chronic obstructive pulmonary disease, anxiety, and paroxysmal atrial fibrillation who presented to the emergency room complaining of acute onset abdominal pain along with nausea and vomiting. He has had some ongoing cough and shortness of breath for about a month. The patient was found to have bilateral basal pneumonia along with possible small bowel obstruction and was admitted to the hospitalist service with consulting surgeon, Dr. Egbert Garibaldi.  HOSPITAL COURSE:  1. Small bowel obstruction. The patient was initially n.p.o. and a NG tube was tried to be placed but the patient could not tolerate this. The patient did have spontaneous two bowel movements and started tolerating his liquid diet and later advanced to solid food which he tolerated well and the diagnosis of constipation causing ileus was made. Repeat abdominal x-ray showed no acute obstruction, and on the day of discharge the patient is tolerating his breakfast well without nausea and vomiting, had two bowel movements yesterday.  2. Bilateral basal  pneumonia. The patient has had cough and ongoing shortness of breath likely secondary from his chronic obstructive pulmonary disease, but had elevated white count of 17,000 and infiltrates on his x-ray for which he is on levofloxacin and is doing well. He is afebrile, saturating 96% on room air, and lung examination is normal.  3. Acute renal failure. The patient had elevated creatinine at the time of admission along with being dehydrated, which has resolved with IV fluids. He also had some mild hyperkalemia of 5.5 secondary to the acute renal failure, which has resolved.  4. Hypertension. The patient has been hypotensive secondary to his severe dehydration and pneumonia and his hydrochlorothiazide is being stopped. This can be restarted when he follows up with his primary care physician if he continues to be hypertensive. Presently the patient's blood pressure is 110/55.  5. Paroxysmal atrial fibrillation is well controlled. The patient has been on Multaq during the hospital stay.   DISCHARGE MEDICATIONS:  1. Finasteride 5 mg oral daily.  2. Flomax 0.4 mg daily.  3. Flovent 110 mcg 2 puffs two times a day.  4. Fluocinonide 0.05% apply to affected areas two times a day.  5. GlycoLax one packet 2 times a day.  6. Hydrochlorothiazide 25 mg oral 1/2 tablet once a day (stopped). 7. Multaq 200 mg oral 2 times a day.  8. Pradaxa 150 mg oral 2 times a day.  9. Spiriva 18 mcg inhaled daily.  10. Wellbutrin 100 mg oral daily.  11. Levofloxacin 500 mg oral daily for six days.   DISCHARGE INSTRUCTIONS: The patient will follow up with his primary care physician in a week. He is to complete his antibiotics as directed. The  patient will include fiber in his diet and use stool softeners as recommended. He is to return to the emergency room if he has any worsening of his symptoms. This plan was discussed with the patient who has verbalized understanding and is okay with the plan. He is being set up with home  health for physical therapy as recommended by physical therapy evaluation in the hospital.   TIME SPENT: Time spent today on this discharge dictation along with coordinating care and counseling of the patient was 35 minutes. ____________________________ Molinda BailiffSrikar R. Sharetha Newson, MD srs:slb D: 06/03/2012 09:44:00 ET T: 06/03/2012 12:52:12 ET JOB#: [id number]  cc: [facility] Amirah Goerke R. Abshir Paolini, MD, <Dictator> Orie FishermanSRIKAR R Sabirin Baray MD ELECTRONICALLY SIGNED 06/05/2012 7:57

## 2015-03-10 NOTE — H&P (Signed)
PATIENT NAME:  James Gibson, James Gibson MR#:  [id number] DATE OF BIRTH:  01-30-1930  DATE OF ADMISSION:  03/30/2013  REFERRING PHYSICIAN: Rebecka Apley, MD  PRIMARY CARE PHYSICIAN: [facility]   CHIEF COMPLAINT: Multiple falls, weakness.   HISTORY OF PRESENT ILLNESS: This is an 81 year old male with significant past medical history of COPD, paroxysmal AFib, hypertension, history of lung cancer with recent diagnosis of recurrence of the lung cancer who was recently discharged on May 6th from [facility] where he had lung nodule and lung mass where he had PET scan done which did show evidence of lung cancer. The patient presents with complaints of multiple falls and generalized weakness. The patient reports that he has been feeling generally weak with decreased p.o. intake and fluid intake over the last few days, where he has been feeling off balance and had multiple falls. The patient had a CT of the brain done in ED which did not show any acute finding. As well, the patient has been complaining of cough with productive sputum, yellow in color, and feeling lightheaded. The patient had positive urine analysis, had significant wheezing in ED, his chest x-ray did show diffuse changes, but no localized opacity or infiltrate. Hospitalist Service was requested to admit the patient for further treatment of his UTI and COPD exacerbation. The patient denies any chest pain, any fever or chills, but he had low grade temp 99.3 in ED. Denies any abdominal pain, any coffee-ground emesis, bright red blood per rectum. Complains of generalized weakness and decreased p.o. intake.   PAST MEDICAL HISTORY: 1. Anxiety.  2. Depression.  3. Paroxysmal AFib. 4. Chronic obstructive pulmonary disease.  5. Hypertension.  6. BPH. 7. Lung cancer.  8. Throat cancer.   PAST SURGICAL HISTORY:  1. Surgery for lung and throat cancer.  2. Hemorrhoidectomy.  3. Appendectomy.   SOCIAL HISTORY: The patient quit smoking 13 years ago.  Denies any alcohol or drug use.   FAMILY HISTORY: Significant for hypertension in his mother and sister has breast and thyroid cancer.   REVIEW OF SYSTEMS:  CONSTITUTIONAL: The patient complains of fatigue, weakness, decreased p.o. intake.  EYES: Denies blurry vision, double vision, pain, inflammation.  EARS, NOSE, THROAT: Denies tinnitus, ear pain, hearing loss, epistaxis or discharge.  RESPIRATORY: Complains of cough, shortness of breath. Denies any hemoptysis. Has history of COPD.  CARDIOVASCULAR: Denies chest pain, edema, arrhythmia, palpitations, syncope. Complains of lightheadedness.  GASTROINTESTINAL: Denies nausea, vomiting, diarrhea, abdominal pain, hematemesis, rectal bleed, constipation or melena.  GENITOURINARY: Denies dysuria, hematuria, renal colic.  ENDOCRINE: Denies polyuria, polydipsia, heat or cold intolerance.  HEMATOLOGY: Denies anemia, easy bruising, bleeding diathesis.  INTEGUMENTARY: Denies acne, rash or skin lesions.  MUSCULOSKELETAL: Complains of the multiple falls, generalized weakness, ambulates with assistance.  NEUROLOGIC: Denies CVA, TIA, seizures, headache, tremors, migraine, epilepsy.  PSYCHIATRIC: Has history of anxiety. Denies substance abuse, alcohol abuse, bipolar disorder or schizophrenia.   PHYSICAL EXAMINATION:  VITAL SIGNS: Temperature 99.5, pulse 93 respiratory rate 22, blood pressure 159/65, saturating 94% on room air.  GENERAL: Frail, chronically ill-appearing male who lies in bed in mild respiratory distress with significant cough.  HEENT: Head atraumatic, normocephalic. Pupils are equal and reactive to light. Pink conjunctivae. Anicteric sclerae. Dry oral mucosa. Edentulous.  CHEST: The patient had decreased air entry bilaterally with diffuse wheezing and coarse respiratory sounds.  CARDIOVASCULAR: S1, S2 heard. No rubs, murmurs, gallops.  ABDOMEN: Soft, nontender, nondistended. Bowel sounds present.  EXTREMITIES: No edema. No clubbing. No  cyanosis.  PSYCHIATRIC: Appropriate affect. Awake, alert x 3. Intact judgment and insight.  SKIN: Delayed skin turgor. No rash.   PERTINENT LABORATORY DATA: Glucose 98, BUN 10, creatinine 1.09,, potassium 4.4, chloride 97, CO2 of 26. ALT 16 AST 26, alkaline phosphatase 113, total bilirubin 0.7. Troponin less than 0.02. White blood cells 9.9, hemoglobin 14.6, hematocrit 42.3, platelets 155. Urine analysis showing leukocyte esterase +2 with 5 white blood cells.   EKG showing normal sinus rhythm with a right bundle branch block at 89 beats per minute.  Right bundle branch block appears to be old once compared to previous EKG in July 2013.   ASSESSMENT AND PLAN:  1. Multiple falls and generalized weakness. This is secondary to deconditioning given his poor baseline status secondary to his lung cancer, as well due to urinary tract infection and chronic obstructive pulmonary disease exacerbation. Will have Physical Therapy consulted.  2. Chronic obstructive pulmonary disease exacerbation. We will start the patient on Xopenex secondary to ALBUTEROL ALLERGY and ipratropium. Will have him on IV Solu-Medrol 80 mg every 8 hours, will have him on levofloxacin given his productive sputum, will have him on Mucinex, will have him on oxygen to keep his oxygen saturation around 94. As well, will resume him on his Spiriva.  3. Urinary tract infection. Will have him on levofloxacin. Will follow on urine culture.  4. Lung cancer. [facility]. The [facility].  5. Benign prostatic hypertrophy. Continue with tamsulosin and Proscar.  6. Paroxysmal atrial fibrillation. Currently normal sinus rhythm on anticoagulation with Pradaxa and on amiodarone for rate control.  7. Hypertension. The patient does not appear to be on any home medication. We will monitor closely.  8. Depression. Continue with bupropion. 9. Deep vein thrombosis prophylaxis. The patient is on Pradaxa.   10. Gastrointestinal prophylaxis. Started on Protonix as the patient is on large dose of steroids.  11. Code status. Discussed with the patient at length. He wishes to be limited code. He does not want to be intubated, but he wants other resucitative measures to be done including chest compressions, cardioversion and medications.   Total time spent on admission and patient care: 60 minutes.     ____________________________ Starleen Armsawood S. Barnaby Rippeon, MD dse:es D: 03/30/2013 08:28:15 ET T: 03/30/2013 09:21:10 ET JOB#: [id number]  cc: Starleen Armsawood S. Joann Jorge, MD, <Dictator> Kashius Dominic Teena IraniS Caydn Justen MD ELECTRONICALLY SIGNED 03/31/2013 7:56

## 2015-03-10 NOTE — Discharge Summary (Signed)
PATIENT NAME:  James Gibson, James Gibson MR#:  [id number] DATE OF BIRTH:  02/25/1930  DATE OF ADMISSION:  03/30/2013 DATE OF DISCHARGE: 04/03/2013    ADMISSION DIAGNOSES:  1. Generalized weakness.  2. Acute on chronic respiratory failure secondary to chronic obstructive pulmonary disease exacerbation.   DISCHARGE DIAGNOSES:  1. Generalized weakness secondary to acute chronic obstructive pulmonary disease exacerbation and deconditioning.  2. Chronic obstructive pulmonary disease, acute exacerbation.  3. History of lung cancer.  4. Hyponatremia.  5. Benign prostatic hypertrophy.  6. Paroxysmal atrial fibrillation.   CONSULTATIONS: None.  LABORATORIES AT DISCHARGE: Sodium 136, potassium 4.6, chloride 100, bicarbonate 30, BUN 17, creatinine 1.04, glucose 171. White blood cells 13, hemoglobin 13.3, hematocrit 39 and platelets are 182. Blood cultures negative to date.   IMAGING: CT chest with contrast showed pulmonary nodules within the left hemithorax, the largest projects within the perihilar region measuring 1.5 cm x 1.16 cm. Moderate to severe interstitial fibrotic changes.   HOSPITAL COURSE: An 81 year old male who presented with shortness of breath, found to have acute COPD exacerbation and generalized weakness. For further details, please refer to the H and P.   1. Generalized weakness likely from chronic obstructive pulmonary disease and deconditioning. PT consult was placed. They recommended rehabilitation. The patient is medically stable now for discharge to rehab.  2. Chronic obstructive pulmonary disease acute exacerbation with bronchitis. The patient's status has improved. He initially had bilateral wheezing. He was started on IV steroids, which was transitioned to p.o. steroids. He was on O2; however, at discharge, he does not need O2. He was given inhalers and nebulizers, which has improved.  3. History of lung cancer, initial diagnosis in 2007. We performed a CT chest which did show  pulmonary nodules. [facility], and most recent PET scan apparently also revealed a small nodule. According to the patient, the plan is for radiation. Will defer this to his outpatient radiation oncologist.  4. Hyponatremia from hypovolemia, has improved with IV fluids.  5. Benign prostatic hypertrophy. The patient was continued on finasteride and Flomax.  6. Paroxysmal atrial fibrillation. The patient has been in normal sinus rhythm.   DISCHARGE MEDICATIONS:  1. Flovent 1 puff b.i.d.  2. Amiodarone 200 mg daily.  3. Bupropion 100 mg b.i.d.  4. Pradaxa 150 b.i.d.  5. Proscar 5 mg daily.  6. Polyethylene glycol 17 grams b.i.d.  7. Flomax 0.4 mg daily.  8. Spiriva 18 mcg daily.  9. Benzonatate 100 mg q.6 hour p.r.n. cough. 10. Prednisone taper starting at 60 mg, taper by 10 mg every 2 days.  11. Levaquin 750 mg for 5 days daily.   DISCHARGE DIET: Low sodium with Ensure 2 times a day.  DISCHARGE REFERRAL: Physical therapy.   DISCHARGE FOLLOWUP:  1. Please monitor vitals b.i.d. If persistently elevated blood pressure greater than 140/80, then contact MD at facility for possible introduction to antihypertensive medicines. 2. The patient should follow up with MD in 1 to 2 weeks at the rehab facility.   TIME SPENT: Approximately 35 minutes on this discharge.    ____________________________ Maahir Horst P. Juliene PinaMody, MD spm:OSi D: 04/03/2013 11:38:41 ET Gibson: 04/03/2013 12:04:10 ET JOB#: [id number]  cc: Offie Pickron P. Juliene PinaMody, MD, <Dictator> [facility] Morgen Ritacco P Everitt Wenner MD ELECTRONICALLY SIGNED 04/05/2013 13:43

## 2015-03-11 NOTE — Consult Note (Signed)
   Comments   I met with pt's daughter. Updated her on pt's current medical condition. She recognizes that pt is likely approaching the end of life. We discussed the options of continued aggressive medical therapy vs comfort care. Daughter wants pt to be comfort care. Orders entered.  would like for pt to go to [facility]. No beds available. Will initiate [facility] hospice. CSW notified.   Electronic Signatures: Tula Schryver, Izora Gala (MD)  (Signed 03-Dec-15 09:45)  Authored: Palliative Care   Last Updated: 03-Dec-15 09:45 by Avik Leoni, Izora Gala (MD)

## 2015-03-11 NOTE — H&P (Signed)
PATIENT NAME:  James Gibson, James Gibson MR#:  [id number] DATE OF BIRTH:  04/05/1930  DATE OF ADMISSION:  10/12/2014  REFERRING PHYSICIAN: Dr. Manson PasseyBrown.   PRIMARY CARE PHYSICIAN: [facility].   CHIEF COMPLAINT: Shortness of breath.   HISTORY OF PRESENT ILLNESS: An 81 year old Caucasian male with history of lung cancer status post treatment, COPD, chronic respiratory failure on 2 L nasal cannula at baseline presenting with shortness of breath. He is unable to provide any meaningful information given medical condition at this time. History obtained from family at bedside. They describe progressive decline in his respiratory status while he resides at [facility]. Complains of shortness of breath with minimal cough, which was nonproductive. No fevers, chills, no chest pain. Upon arrival to the hospital he was hypoxemic, requiring BiPAP therapy to maintain oxygen saturation greater than 92%. Also found to be in atrial fibrillation with rapid ventricular response, maximum heart rate about 160.   REVIEW OF SYSTEMS: Unobtainable at this time given the patient's mental status and medical condition.   PAST MEDICAL HISTORY: Paroxysmal atrial fibrillation with Pradaxa for anticoagulation, COPD with chronic respiratory failure on 2 L nasal cannula at baseline, hypertension, BPH, history of lung carcinoma as well as throat cancer status post surgery.   SOCIAL HISTORY: Remote tobacco abuse. No alcohol or drug use. Currently resides at [facility].   FAMILY HISTORY: Hypertension.   ALLERGIES: ALBUTEROL, CORTISONE, MORPHINE.   HOME MEDICATIONS: Proscar 5 mg p.o. daily, acetaminophen 500 mg 2 tabs needed for pain, oxycodone 5 mg p.o. q.6 hours, milk of magnesia 15 mL p.o. at bedtime, Flomax 0.4 mg p.o. daily, Pradaxa 150 mg p.o. b.i.d., Remeron 15 mg p.o. at bedtime, nystatin 100,000 units/mL 5 mL suspension 4 times daily, Xanax 0.25 mg p.o. 3 times a day as needed for anxiety, Advair 250/50 mcg inhalation b.i.d. DuoNeb  treatments 3 mL 4 times a day as needed for shortness of breath, Pepcid 20 mg p.o. daily, MiraLax 17 g p.o. daily for constipation, senna 8.6 mg p.o. daily, pantoprazole 40 mg p.o. daily, vitamin D3 at 1000 international units p.o. daily.   PHYSICAL EXAMINATION:  VITAL SIGNS: Temperature 99, heart rate of 157, respirations 40, blood pressure 149/86, currently saturating 99% on BiPAP therapy. Weight 56.2 kg, BMI 19.5.  GENERAL: Chronically ill-appearing Caucasian gentleman, currently in minimal to moderate distress given respiratory status as well as atrial fibrillation.  HEAD: Normocephalic, atraumatic.  EYES: Pupils equal, round, react to light. Extraocular muscles intact. No scleral icterus.  MOUTH: Dry mucosal membrane. Dentition intact. No abscess noted.  EAR, NOSE, THROAT: Clear without exudate. No external lesions.  NECK: Supple. No thyromegaly. No nodules. No JVD.  PULMONARY: Grossly diminished breath sounds throughout all lung fields; however, no frank wheezes, rales, or rhonchi. Poor respiratory effort. He is tachypneic requiring BiPAP therapy at this time.  CHEST: Nontender to palpation.  CARDIOVASCULAR: S1, S2. Irregular rate, irregular rhythm, tachycardic. No murmur or gallop. No edema. Pedal pulses 2+ bilaterally. GASTROINTESTINAL: Soft, nontender, nondistended. No masses. Positive bowel sounds. No hepatosplenomegaly. MUSCULOSKELETAL: No swelling, clubbing, edema. Range of motion full in all extremities. NEUROLOGIC: Unable to fully assess given patient's mental status and medical condition.  SKIN: No ulceration, lesions, rash, cyanosis. Skin warm and dry. Turgor intact.  PSYCHIATRIC: We will to fully assess given patient's mental status and medical condition. He is awake, alert, oriented to person at this time. He has difficulty following commands.   LABORATORY DATA: EKG performed atrial fibrillation with rapid ventricular response, heart rate in the  150s. Sodium 142, potassium 3.9,  chloride 103, bicarbonate 32, BUN 32, creatinine 1.28, glucose 135, albumin of 2.6; otherwise, LFTs within normal limits. Troponin 0.04. WBC 24.3, hemoglobin 14.9, platelets of 212,000. INR of 1.2. Urinalysis negative for evidence of infection. Lactic acid of 3.2 Chest x-ray performed reveals left lower opacity concerning for pneumonia.   ASSESSMENT AND PLAN: An 81 year old Caucasian gentleman with history of lung cancer status post wedge resection, chronic obstructive pulmonary disease with chronic respiratory failure requiring 2 L nasal cannula presenting with shortness of breath.  1. Acute on chronic respiratory failure with hypoxemia secondary to healthcare associated pneumonia meeting respiratory status by oxygen saturation requirement of BiPAP, respiratory rate, inability to speak in full sentences. Continue BiPAP therapy. Wean as tolerated. Supplement oxygen to keep oxygen saturation greater than 88%, DuoNeb treatments q.4 hours as well as Solu-Medrol 60 mg intravenous daily.  2. Sepsis. Meeting septic criteria by leukocytosis, respiratory rate, heart rate present on arrival secondary to healthcare associated pneumonia. Panculture including blood and sputum. Antibiotic coverage with vancomycin, Zosyn, and azithromycin. Intravenous fluid to keep  arterial pressure greater than 65 as ordered in the Emergency Department 30 mL/kg of intravenous fluid bolus. Will need repeat lactic acid 3 hours after the initial lactic acid as well.  3. Atrial fibrillation with rapid ventricular response. Received Cardizem intravenous bolus with transient response. We will re-bolus and start on Cardizem drip at a rate of 5, goal heart rate less than 120 on Pradaxa for anticoagulation.  4. Gastroesophageal reflux disease. Proton pump inhibitor therapy.  5. Venous thromboembolism prophylaxis with Pradaxa.   CODE STATUS: The patient is a limited code as in do not intubate; okay for everything else, as confirmed with family  at bedside.   TIME SPENT: 55 minutes critical care.    ____________________________ Cletis Athens. Hower, MD dkh:bm D: 10/12/2014 02:07:00 ET Gibson: 10/12/2014 02:44:44 ET JOB#: [id number]  cc: Cletis Athens. Hower, MD, <Dictator> DAVID Synetta Shadow MD ELECTRONICALLY SIGNED 10/19/2014 23:56

## 2015-03-11 NOTE — Consult Note (Signed)
   Comments   met with pt's daughter, Jocelyn Lamer. Clinical update given, discussed medical goals of therapy for conservative measures with continue diagnostic testing, "I want to try to do all we can do to keep him alive but I don't want him to be on breathing machines". Pt is a limited code-no intubation but wishes are for +CPR/Pressors. I talked at length about chronic disease process, current clinical presentation, acute symptoms, comfort care including hospice care vs aggressive therapies. Questions answered.  daughter requested to "think" about code status and option of making pt comfort care with hospice. Will follow with ongoing discussions of wishes.   Remains Limited code: no intubation/wishes are for CPR/pressorsOngoing discussions of aggressive vs comfort care; daughter will decide, update nursing/pc  Electronic Signatures: Annelise Mccoy Z (NP)  (Signed [phone] 15:32)  Authored: Palliative Care   Last Updated: 27-Nov-15 15:32 by Natalia Leatherwood Z (NP)

## 2015-03-11 NOTE — H&P (Signed)
   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 He was admitted 10/12/2014 with MRSA pneumonia/sepsis. Hospital course complicated by lg L.pleural effusion s/p thoracentesis and also by a.fib with RVR. Pt has not responded to medical therapy and is now comfort care.   Past History as above   Code Status DNR   Past Med/Surgical Hx:  Multi-drug Resistant Organism (MDRO): Positive culture for MRSA.  Lung Cancer:   Cancer, Laryngeal:   COPD:   Atrial Fibrillation:   Cataract Extraction:   ear surgery:   Cervical Fusion:   Hemorrhoidectomy:   Appendectomy:   larynx surgery:   Lobectomy:   ALLERGIES:  Albuterol: Tachycardia  Cortisone: Rash  Morphine: Unknown  HOME MEDICATIONS: Medication Instructions Status  glycopyrrolate 0.2 milligram(s) injectable every 4 hours, As needed, secretions Active  ondansetron 4 milligram(s) injectable every 4 hours, As needed, Nausea/Vomiting Active  LORazepam 0.5 milligram(s) injectable every hour, As needed, for signs of discomfort Active  acetaminophen 650 mg rectal suppository 1 suppository(ies) rectal every 4 hours, As needed, mild pain (1-3/10) or temp. greater than 100.4 Active   Family and Social History:  Family History Non-Contributory   Place of Living Nursing Home   Review of Systems:  SOB/DOE Yes   ROS unable to complete ROS   Medications/Allergies Reviewed Medications/Allergies reviewed   Physical Exam:  GEN cachectic, critically ill appearing   HEENT hearing intact to voice, Oropharynx clear   NECK trachea midline   RESP wheezing  poor air movement   CARD regular rate  regular rhythm   ABD denies tenderness  soft  hypoactive BS   GU foley catheter in place   EXTR negative edema   SKIN No rashes, No ulcers   NEURO grossly nonfocal   PSYCH  lethargic    Assessment/Admission Diagnosis [name] is an 81 yo man with PMH of 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 He was admitted 10/12/2014 with MRSA pneumonia/sepsis. Hospital course complicated by lg L.pleural effusion s/p thoracentesis and also by a.fib with RVR. Pt has not responded to medical therapy and is now comfort care.   Plan 1. Comfort care 2. GIP hospice 3. DNR   Electronic Signatures: Riona Lahti, Harriett SineNancy (MD)  (Signed 03-Dec-15 11:46)  Authored: CHIEF COMPLAINT and HISTORY, PAST MEDICAL/SURGIAL HISTORY, ALLERGIES, HOME MEDICATIONS, FAMILY AND SOCIAL HISTORY, REVIEW OF SYSTEMS, PHYSICAL EXAM, ASSESSMENT AND PLAN   Last Updated: 03-Dec-15 11:46 by Rye Decoste, Harriett SineNancy (MD)

## 2015-03-11 NOTE — Discharge Summary (Signed)
PATIENT NAME:  James Gibson, James Gibson MR#:  [id number] DATE OF BIRTH:  1930/01/15  DATE OF ADMISSION:  10/12/2014 DATE OF DISCHARGE:  10/20/2014  ADMISSION DIAGNOSES:  1.  Acute on chronic respiratory failure with hypoxemia secondary to healthcare-associated pneumonia. 2.  Atrial fibrillation with rapid ventricular response.   DISCHARGE DIAGNOSES: 1. Acute on chronic respiratory failure with hypoxemia secondary to methicillin-resistant Staphylococcus aureus, pneumonia and chronic obstructive pulmonary disease exacerbation. 2.  Large left lobe pleural effusion with consolidation.  3.  Sepsis due to methicillin-resistant Staphylococcus aureus and pneumonia.  4.  Atrial fibrillation, rapid ventricular response.  5.  Acute on diastolic heart failure.  6.  Hypophosphatemia.  7.  Anemia of chronic disease.  8.  Aspiration.   CONSULTATIONS:   1.  Palliative Care. 2.  Pulmonary.   HOSPITAL COURSE: This is an 81 year old male who was admitted on the 25th of November with acute on chronic respiratory failure, found secondary HCAP pneumonia. For further details, please refer to H and P. 1.  Acute on chronic respiratory failure with hypoxia due to MRSA, pneumonia, and COPD exacerbation. The patient is placed on high flow oxygen. He continues to remain on high flow with a very poor prognosis or any sort of movement, he decompensates very quickly.  Respiratory-wise, it has been very difficult to wean him off the high flow oxygen. Therefore, palliative care has been consulted. The family has decided on comfort care. The patient is on comfort care and plan for hospice home when bed available.  2.  Large left pleural effusion with consolidation, appears to be transudative in nature after thoracentesis.  Chest x-ray showed some reaccumulation but not enough for another thoracentesis.  3.  Sepsis due to MRSA pneumonia. The patient was on vancomycin.  4.  Atrial fibrillation and RVR secondary to problem #1 and problem  #3.  Cardiology was consulted. His echo showed preserved ejection fraction.  He was on metoprolol and Pradaxa.  5.  Acute diastolic heart failure. EF was preserved.  He has dilated RA and diastolic dysfunction on echocardiogram. He is on p.o. Lasix. Appreciate cardiology consultation.  6.  Hypophosphatemia, which was repleted p.r.n.  7.  Anemia of chronic disease. His hemoglobin is stable.  8.  Aspiration. The patient was on  high risk for aspiration and was put on aspiration precautions.   DISPOSITION:  The patient is now on comfort care.  Plan for hospice home when bed available.   TIME SPENT: Approximately 40 minutes.    ____________________________ Janyth ContesSital P. Juliene PinaMody, MD spm:DT D: 10/20/2014 12:15:40 ET Gibson: 10/20/2014 14:23:04 ET JOB#: [id number]  cc: Gamble Enderle P. Juliene PinaMody, MD, <Dictator> Janyth ContesSITAL P Duchess Armendarez MD ELECTRONICALLY SIGNED 10/21/2014 13:09

## 2015-03-11 NOTE — Consult Note (Signed)
PATIENT NAME:  James FiddlerLMER, James Gibson MR#:  [id number] DATE OF BIRTH:  03-14-1930  DATE OF CONSULTATION:  10/14/2014  REFERRING PHYSICIAN:   CONSULTING PHYSICIAN:  Laurier NancyShaukat A. Lyndzie Zentz, MD  INDICATION FOR THE CONSULT:  Elevated troponin, chest pain, atrial fibrillation, and CHF.    HISTORY OF PRESENT ILLNESS: This is an 81 year old white male with a past medical history of lung cancer status post resection and COPD who presented to the Emergency Room with supposedly pneumonia and pleural effusion and hypoxia and atrial fibrillation. He was having atrial fibrillation with rapid ventricular response rate on admission. He now is being transferred to CCU because of worsening of shortness of breath. He says he was having some chest tightness when he first came in and was very short of breath and diaphoretic, but those symptoms are much better now with BiPAP and CPAP.   PAST MEDICAL HISTORY: History of paroxysmal atrial fibrillation, he is on Pradaxa, history of COPD on 2 liter nasal cannula at home, hypertension, BPH, lung cancer, history of throat cancer status post surgery.   SOCIAL HISTORY:  He has a history of smoking,  not right now. History of ETOH abuse.   FAMILY HISTORY: Positive for hypertension.   ALLERGIES: ALBUTEROL, CORTISONE, AND MORPHINE.   HOME MEDICATIONS: Pradaxa, nystatin, Xanax, Proscar, acetaminophen, oxycodone.   PHYSICAL EXAMINATION:  GENERAL: He is alert, oriented x 3, in no acute distress right now.  VITAL SIGNS: His pulse is 80, respirations are 25, his blood pressure is 118/55, saturation is 93.  HEENT: Positive JVD.  LUNGS: A few rhonchi bilaterally and decreased air entry on the left side.  HEART: Regular rate and rhythm. Normal S1, S2. No audible murmur.  ABDOMEN: Soft, nontender, positive bowel sounds.  EXTREMITIES: No pedal edema.   DIAGNOSTIC DATA: EKG shows normal sinus rhythm, no acute changes. His EKG on admission showed atrial fibrillation at 154 beats per minute,  right bundle branch block, nonspecific ST-Gibson changes. His troponin on admission was 0.14, then 0.13. His BUN and creatinine were 128 and 32. His white count on admission was high at 24.3, right now white count is 11.8.   ASSESSMENT AND PLAN: The patient has paroxysmal atrial fibrillation on Pradaxa 150 b.i.d.  He right now is in sinus rhythm. He is getting diltiazem 30 mg q. 6 hours, advise continuation of that, remains in sinus rhythm. He also has congestive heart failure on chest x-ray with left pleural effusion which he is going to have thoracocentesis done today, that should improve his shortness of breath. He also has pneumonia supposedly on the left side and he is getting antibiotic azithromycin and advise continuation of that along with vancomycin. He has supposedly congestive heart failure, advise continuation of Lasix 20 mg IV q. 12. We will get an echocardiogram to further evaluate his ejection fraction and make further recommendations as far as mildly elevated troponin too after looking at the wall motion.      ____________________________ Laurier NancyShaukat A. Kynnedy Carreno, MD sak:bu D: 10/14/2014 13:19:49 ET Gibson: 10/14/2014 13:29:39 ET JOB#: [id number]  cc: Laurier NancyShaukat A. Leovanni Bjorkman, MD, <Dictator> Laurier NancySHAUKAT A Rajni Holsworth MD ELECTRONICALLY SIGNED 10/20/2014 15:38

## 2015-03-11 NOTE — Discharge Summary (Signed)
PATIENT NAME:  James FiddlerLMER, Stace T MR#:  [id number] DATE OF BIRTH:  31-Dec-1929  DATE OF ADMISSION:  10/20/2014 DATE OF DISCHARGE:    HISTORY OF PRESENT ILLNESS:  The patient was actually admitted on 10/20/2014 for inpatient Hospice Home. He is actually being discharged to [facility] outpatient today on 10/21/2014.    VITAS NOT TAKEN  GEN fragile cachectic  CVS: irregular, +2/6 SEM tacfhycardia LUNGS wheezing and rhonchill bilateral decreaed throughout  ABD BS+ nT/ND no rebound/guarding EXT no edema cyanosis   DISCHARGE MEDICATIONS:   1.  Hydromorphone 0.5 mg injectable q. 3 hours p.r.n. pain or temperature.  2.  Tylenol 650 mg rectally q. 4 hours p.r.n.  3.  Ativan 0.5 one to two tablets sublingual q. 2-4 hours p.r.n. agitation.  4.  Zofran 4 mg every 6 hours p.r.n. nausea or vomiting.   DISCHARGE DIET: As tolerated.   DISCHARGE OXYGEN: P.r.n. for comfort.   DISPOSITION: The patient is being discharge to Hospice Home.   TIME SPENT: 35 minutes.     ____________________________ Janyth ContesSital P. Juliene PinaMody, MD spm:bu D: 10/21/2014 12:58:26 ET T: 10/21/2014 13:10:23 ET JOB#: [id number]  cc: Lauren Modisette P. Juliene PinaMody, MD, <Dictator> Janyth ContesSITAL P Eara Burruel MD ELECTRONICALLY SIGNED 10/21/2014 14:04

## 2015-03-23 IMAGING — CR DG CHEST 1V PORT
1 series · 1 of 1 positions shown · non-contrast
Comparison: 10/14/2014

CLINICAL DATA: CHF, pneumonia

EXAM:
PORTABLE CHEST - 1 VIEW

[ap]
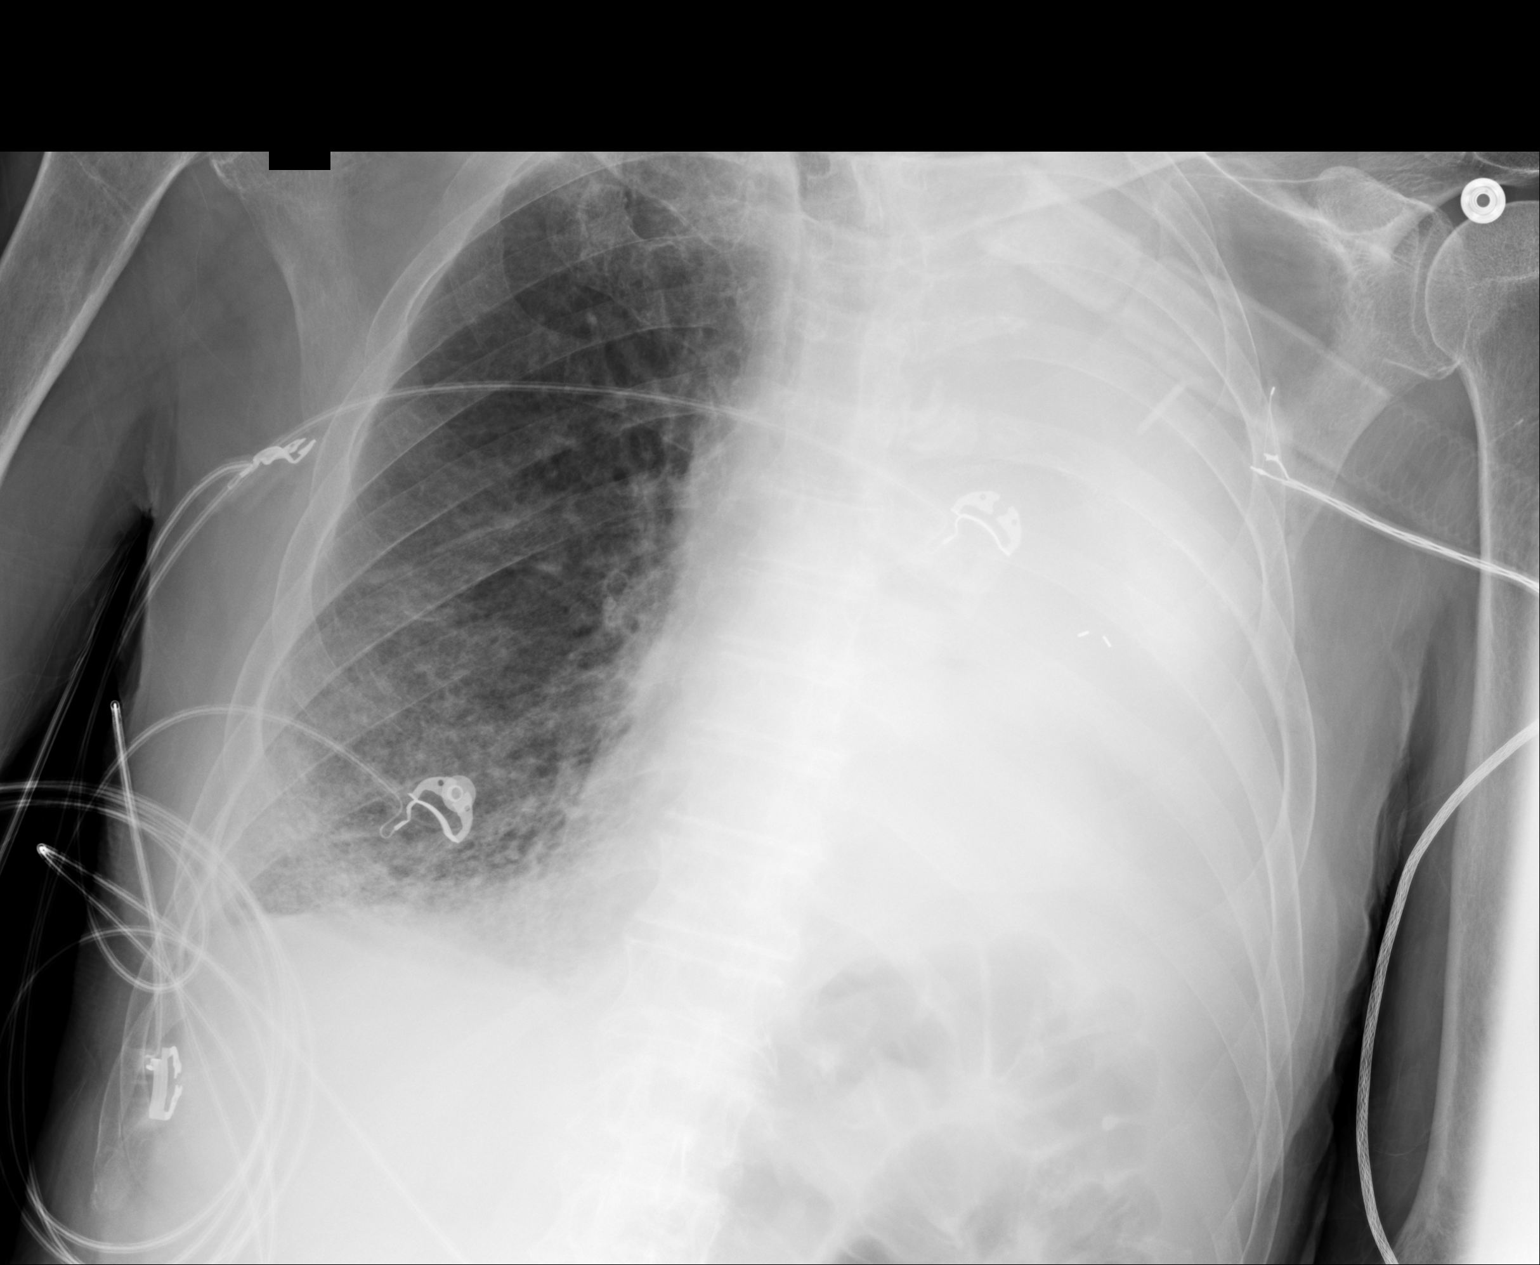

[1 of 1 positions shown; findings below may reference images not displayed]

FINDINGS: Interval complete opacification of the left hemi thorax. No
significant mediastinal shift. Slight improvement in patchy airspace
opacities at the right lung base. Probable small right pleural
effusion. Heart size difficult to assess due to adjacent opacities.
Regional bones unremarkable.
IMPRESSION: 1. New, complete opacification of the left hemithorax suggesting
central obstruction and/or effusion.
2. Stable airspace disease at the right lung base with small
effusion.

## 2015-03-24 IMAGING — US US ATTEMPTED THORACENTESIS LEFT
1 series · 6 of 6 positions shown · non-contrast
Comparison: None.

CLINICAL DATA: Left pleural effusion.

EXAM:
CHEST ULTRASOUND

[Series 1: us attempted thoracentesis left · 0.26mm/px · 6 of 6 slices shown]
[im 1/6]
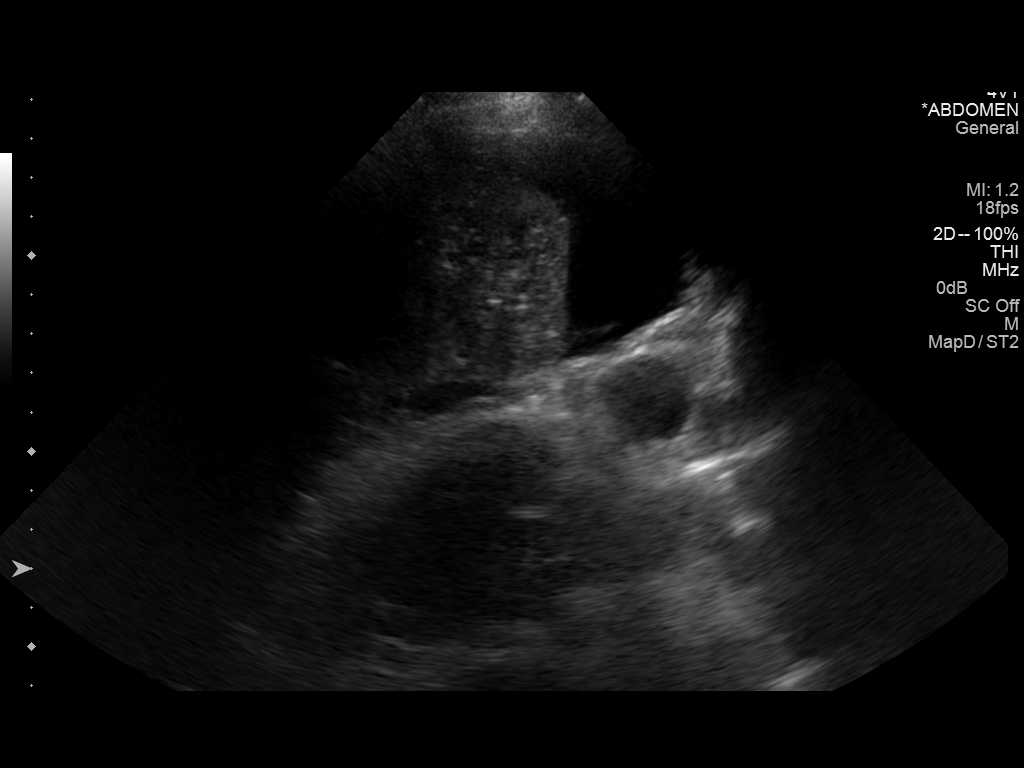
[im 2/6]
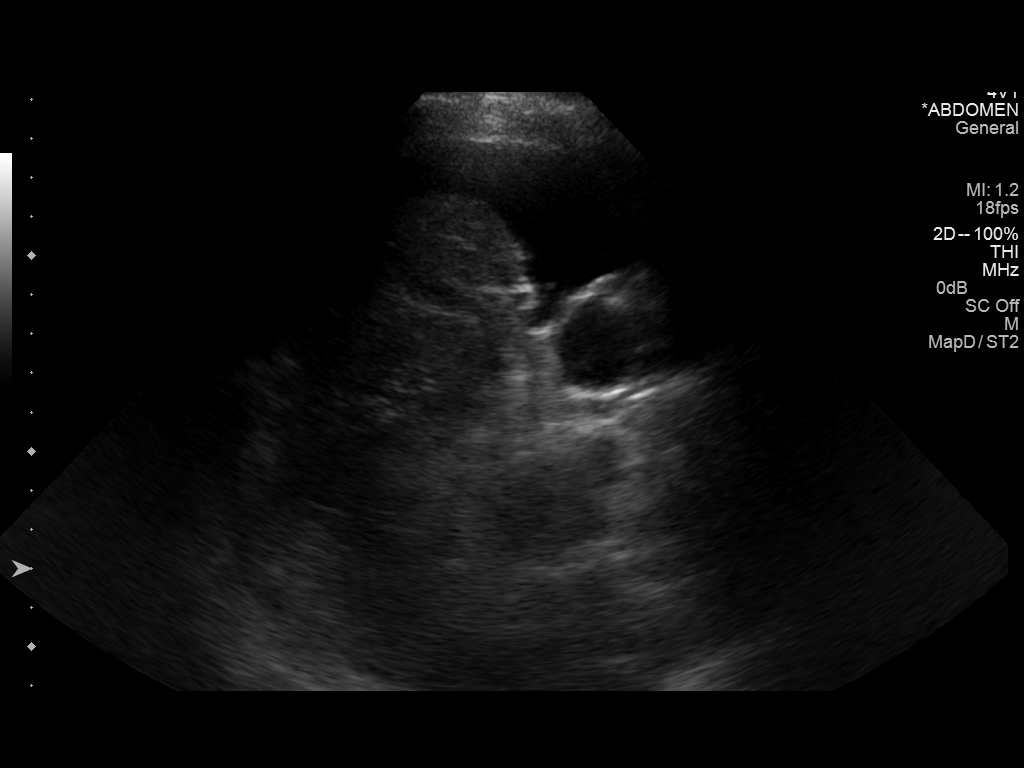
[im 3/6]
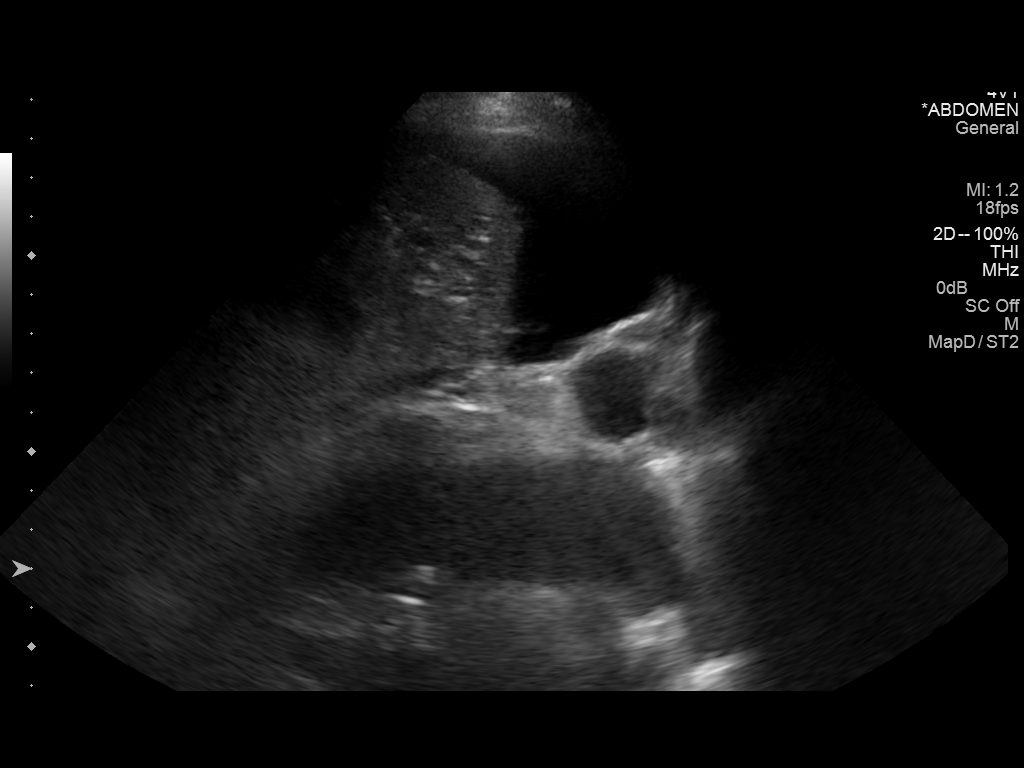
[im 4/6]
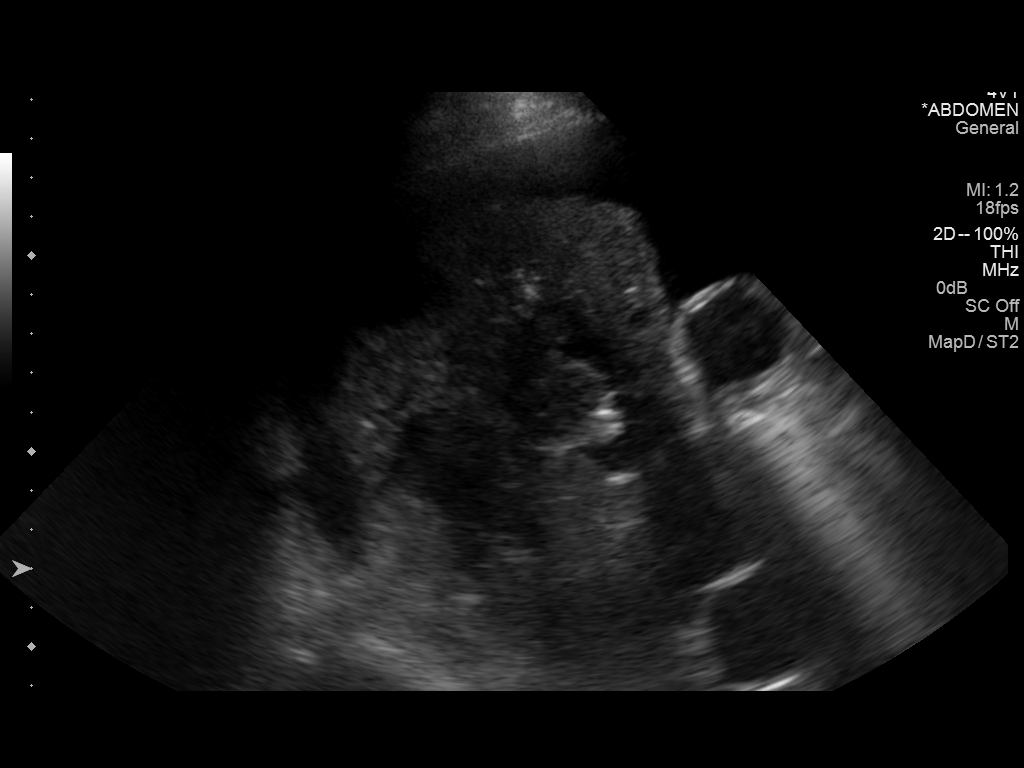
[im 5/6]
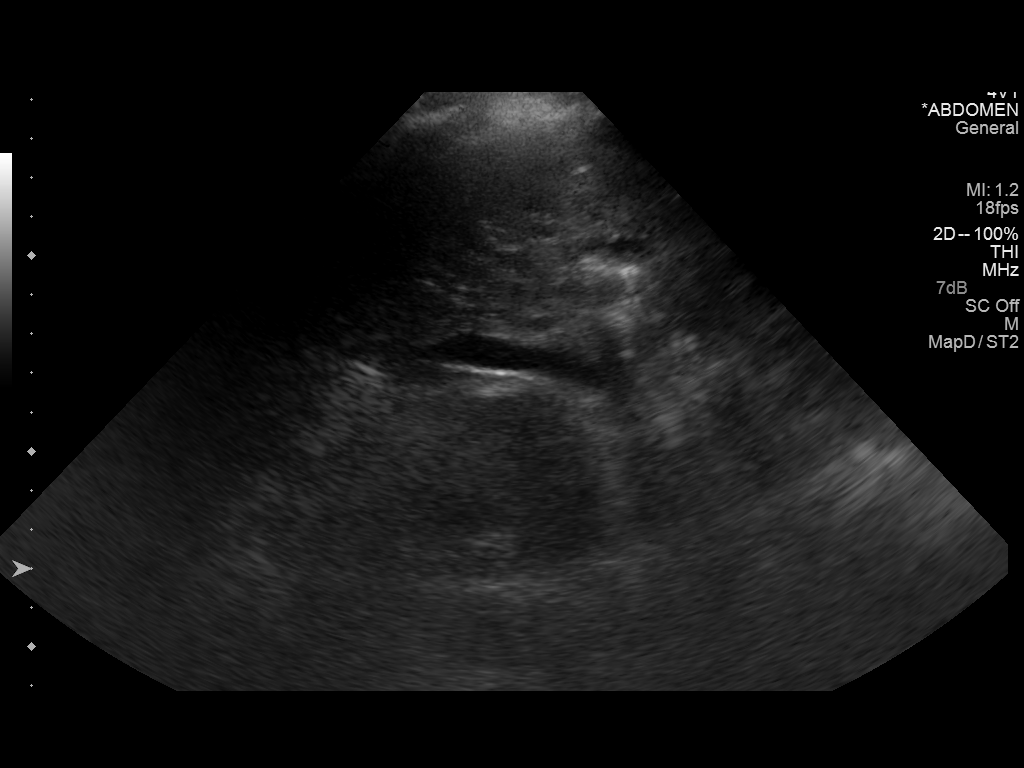
[im 6/6]
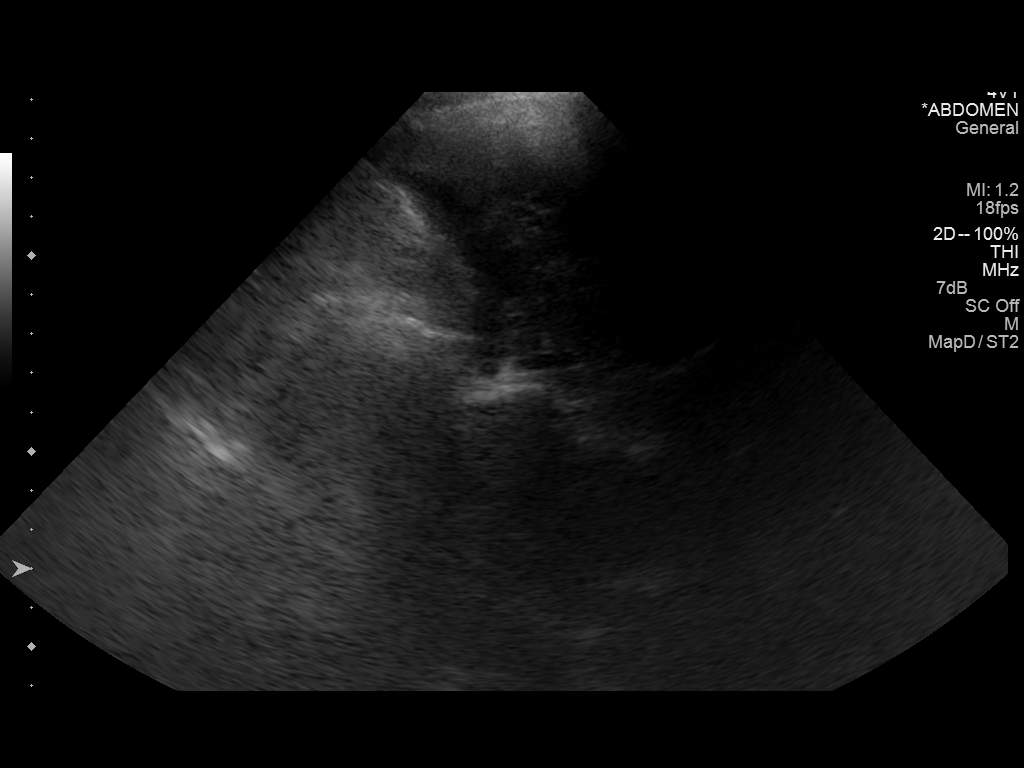

[6 of 6 positions shown; findings below may reference images not displayed]

FINDINGS: Ultrasound evaluation was performed for potential thoracentesis.
Minimal left pleural effusion is noted, but no adequate fluid pocket
to safely perform thoracentesis was noted.
IMPRESSION: No adequate fluid pocket seen in left chest, and therefore
thoracentesis was not performed.
